# Patient Record
Sex: Male | Born: 1935 | Race: White | Hispanic: No | Marital: Married | Smoking: Former smoker
Health system: Southern US, Community
[De-identification: ages and names within clinical notes are randomized; demographics above are authoritative.]

## PROBLEM LIST (undated history)

## (undated) DIAGNOSIS — K219 Gastro-esophageal reflux disease without esophagitis: Secondary | ICD-10-CM

## (undated) DIAGNOSIS — J189 Pneumonia, unspecified organism: Secondary | ICD-10-CM

## (undated) DIAGNOSIS — M1990 Unspecified osteoarthritis, unspecified site: Secondary | ICD-10-CM

## (undated) HISTORY — PX: BACK SURGERY: SHX140

## (undated) HISTORY — PX: SHOULDER OPEN ROTATOR CUFF REPAIR: SHX2407

---

## 2007-10-29 ENCOUNTER — Emergency Department: Payer: Self-pay | Admitting: Emergency Medicine

## 2010-07-28 ENCOUNTER — Ambulatory Visit: Payer: Self-pay | Admitting: Family Medicine

## 2011-06-27 ENCOUNTER — Other Ambulatory Visit: Payer: Self-pay | Admitting: Neurosurgery

## 2011-07-05 ENCOUNTER — Encounter (HOSPITAL_COMMUNITY): Payer: Self-pay | Admitting: Pharmacy Technician

## 2011-07-10 ENCOUNTER — Encounter (HOSPITAL_COMMUNITY): Payer: Self-pay

## 2011-07-10 ENCOUNTER — Encounter (HOSPITAL_COMMUNITY): Payer: Self-pay | Admitting: Pharmacy Technician

## 2011-07-10 ENCOUNTER — Encounter (HOSPITAL_COMMUNITY)
Admission: RE | Admit: 2011-07-10 | Discharge: 2011-07-10 | Disposition: A | Discharge status: Home / Self Care | Payer: Medicare Other | Source: Ambulatory Visit | Attending: Neurosurgery | Admitting: Neurosurgery

## 2011-07-10 HISTORY — DX: Gastro-esophageal reflux disease without esophagitis: K21.9

## 2011-07-10 HISTORY — DX: Pneumonia, unspecified organism: J18.9

## 2011-07-10 HISTORY — DX: Unspecified osteoarthritis, unspecified site: M19.90

## 2011-07-10 LAB — BASIC METABOLIC PANEL
BUN: 20 mg/dL (ref 6–23)
Creatinine, Ser: 0.91 mg/dL (ref 0.50–1.35)
GFR calc Af Amer: >90 mL/min (ref >90)
GFR calc non Af Amer: 82 mL/min — ABNORMAL LOW (ref >90)
Glucose, Bld: 72 mg/dL (ref 70–99)

## 2011-07-10 LAB — TYPE AND SCREEN
ABO/RH(D): O POS
Antibody Screen: NEGATIVE

## 2011-07-10 LAB — CBC
HCT: 42.8 % (ref 39.0–52.0)
Hemoglobin: 15.3 g/dL (ref 13.0–17.0)
MCH: 32 pg (ref 26.0–34.0)
MCHC: 35.7 g/dL (ref 30.0–36.0)
RDW: 12 % (ref 11.5–15.5)

## 2011-07-10 LAB — SURGICAL PCR SCREEN: MRSA, PCR: NEGATIVE

## 2011-07-10 NOTE — Pre-Procedure Instructions (Signed)
20 Arthur Orozco  07/10/2011   Your procedure is scheduled on:  07/18/11  Report to [facility] at 5:30 AM.  Call this number if you have problems the morning of surgery: [phone]   Remember:   Do not eat food:After Midnight.  May have clear liquids: up to 4 Hours before arrival.  Clear liquids include soda, tea, black coffee, apple or grape juice, broth.  Take these medicines the morning of surgery with A SIP OF WATER: pain medicine is OK if needed   Do not wear jewelry, make-up or nail polish.  Do not wear lotions, powders, or perfumes. You may wear deodorant.  Do not shave 48 hours prior to surgery.  Do not bring valuables to the hospital.  Contacts, dentures or bridgework may not be worn into surgery.  Leave suitcase in the car. After surgery it may be brought to your room.  For patients admitted to the hospital, checkout time is 11:00 AM the day of discharge.   Patients discharged the day of surgery will not be allowed to drive home.  Name and phone number of your driver: with WIFE  Special Instructions: CHG Shower Use Special Wash: 1/2 bottle night before surgery and 1/2 bottle morning of surgery.   Please read over the following fact sheets that you were given: Pain Booklet, Coughing and Deep Breathing, Blood Transfusion Information, MRSA Information and Surgical Site Infection Prevention

## 2011-07-17 MED ORDER — CEFAZOLIN SODIUM-DEXTROSE 2-3 GM-% IV SOLR
2.0000 g | INTRAVENOUS | Status: AC
  Administered 2011-07-18 (×2): 2 g via INTRAVENOUS
  Filled 2011-07-17: qty 50

## 2011-07-18 ENCOUNTER — Encounter (HOSPITAL_COMMUNITY)
Admission: RE | Disposition: A | Discharge status: Home / Self Care | Payer: Self-pay | Source: Ambulatory Visit | Attending: Neurosurgery

## 2011-07-18 ENCOUNTER — Inpatient Hospital Stay (HOSPITAL_COMMUNITY): Payer: Medicare Other

## 2011-07-18 ENCOUNTER — Encounter (HOSPITAL_COMMUNITY): Payer: Self-pay | Admitting: Surgery

## 2011-07-18 ENCOUNTER — Inpatient Hospital Stay (HOSPITAL_COMMUNITY)
Admission: RE | Admit: 2011-07-18 | Discharge: 2011-07-21 | DRG: 458 | Disposition: A | Discharge status: Home / Self Care | Payer: Medicare Other | Source: Ambulatory Visit | Attending: Neurosurgery | Admitting: Neurosurgery

## 2011-07-18 ENCOUNTER — Encounter (HOSPITAL_COMMUNITY): Payer: Self-pay | Admitting: Anesthesiology

## 2011-07-18 ENCOUNTER — Inpatient Hospital Stay (HOSPITAL_COMMUNITY): Payer: Medicare Other | Admitting: Anesthesiology

## 2011-07-18 DIAGNOSIS — M51379 Other intervertebral disc degeneration, lumbosacral region without mention of lumbar back pain or lower extremity pain: Principal | ICD-10-CM | POA: Diagnosis present

## 2011-07-18 DIAGNOSIS — M4150 Other secondary scoliosis, site unspecified: Secondary | ICD-10-CM | POA: Diagnosis present

## 2011-07-18 DIAGNOSIS — M48062 Spinal stenosis, lumbar region with neurogenic claudication: Secondary | ICD-10-CM | POA: Diagnosis present

## 2011-07-18 DIAGNOSIS — M4310 Spondylolisthesis, site unspecified: Secondary | ICD-10-CM | POA: Diagnosis present

## 2011-07-18 DIAGNOSIS — M5137 Other intervertebral disc degeneration, lumbosacral region: Principal | ICD-10-CM | POA: Diagnosis present

## 2011-07-18 SURGERY — POSTERIOR LUMBAR FUSION 3 LEVEL
Anesthesia: General | Site: Back | Wound class: Clean

## 2011-07-18 MED ORDER — SODIUM CHLORIDE 0.9 % IJ SOLN: 3.0000 mL | INTRAMUSCULAR | Status: DC | PRN

## 2011-07-18 MED ORDER — DEXTROSE-NACL 5-0.45 % IV SOLN
INTRAVENOUS | Status: DC
  Administered 2011-07-19 (×2): via INTRAVENOUS

## 2011-07-18 MED ORDER — HYDROMORPHONE HCL PF 1 MG/ML IJ SOLN
INTRAMUSCULAR | Status: AC
  Filled 2011-07-18: qty 1

## 2011-07-18 MED ORDER — NALOXONE HCL 0.4 MG/ML IJ SOLN: 0.4000 mg | INTRAMUSCULAR | Status: DC | PRN

## 2011-07-18 MED ORDER — ACETAMINOPHEN 325 MG PO TABS: 650.0000 mg | ORAL_TABLET | ORAL | Status: DC | PRN

## 2011-07-18 MED ORDER — DOCUSATE SODIUM 100 MG PO CAPS
100.0000 mg | ORAL_CAPSULE | Freq: Two times a day (BID) | ORAL | Status: DC
  Administered 2011-07-18 – 2011-07-20 (×5): 100 mg via ORAL
  Filled 2011-07-18 (×5): qty 1

## 2011-07-18 MED ORDER — NEOSTIGMINE METHYLSULFATE 1 MG/ML IJ SOLN
INTRAMUSCULAR | Status: DC | PRN
  Administered 2011-07-18: 3.5 mg via INTRAVENOUS

## 2011-07-18 MED ORDER — ALBUMIN HUMAN 5 % IV SOLN
INTRAVENOUS | Status: DC | PRN
  Administered 2011-07-18: 12:00:00 via INTRAVENOUS

## 2011-07-18 MED ORDER — ONDANSETRON HCL 4 MG/2ML IJ SOLN: 4.0000 mg | Freq: Four times a day (QID) | INTRAMUSCULAR | Status: DC | PRN

## 2011-07-18 MED ORDER — SODIUM CHLORIDE 0.9 % IJ SOLN
3.0000 mL | Freq: Two times a day (BID) | INTRAMUSCULAR | Status: DC
  Administered 2011-07-19 – 2011-07-20 (×2): 3 mL via INTRAVENOUS

## 2011-07-18 MED ORDER — ROCURONIUM BROMIDE 100 MG/10ML IV SOLN
INTRAVENOUS | Status: DC | PRN
  Administered 2011-07-18: 20 mg via INTRAVENOUS
  Administered 2011-07-18: 50 mg via INTRAVENOUS
  Administered 2011-07-18 (×3): 20 mg via INTRAVENOUS

## 2011-07-18 MED ORDER — EPHEDRINE SULFATE 50 MG/ML IJ SOLN
INTRAMUSCULAR | Status: DC | PRN
  Administered 2011-07-18 (×3): 10 mg via INTRAVENOUS

## 2011-07-18 MED ORDER — LIDOCAINE-EPINEPHRINE 1 %-1:100000 IJ SOLN
INTRAMUSCULAR | Status: DC | PRN
  Administered 2011-07-18: 5 mL

## 2011-07-18 MED ORDER — MORPHINE SULFATE (PF) 1 MG/ML IV SOLN
INTRAVENOUS | Status: DC
  Administered 2011-07-18 (×2): via INTRAVENOUS
  Administered 2011-07-18: 24 mg via INTRAVENOUS
  Administered 2011-07-19: 1.5 mg via INTRAVENOUS
  Administered 2011-07-19: 10 mg via INTRAVENOUS
  Administered 2011-07-19: 25 mg via INTRAVENOUS
  Administered 2011-07-19: 10.5 mg via INTRAVENOUS
  Administered 2011-07-19: 6 mg via INTRAVENOUS
  Administered 2011-07-20 (×2): 1.5 mg via INTRAVENOUS

## 2011-07-18 MED ORDER — VITAMIN D3 25 MCG (1000 UNIT) PO TABS
2000.0000 [IU] | ORAL_TABLET | Freq: Every day | ORAL | Status: DC
  Administered 2011-07-18 – 2011-07-21 (×4): 2000 [IU] via ORAL
  Filled 2011-07-18 (×4): qty 2

## 2011-07-18 MED ORDER — SODIUM CHLORIDE 0.9 % IJ SOLN: 9.0000 mL | INTRAMUSCULAR | Status: DC | PRN

## 2011-07-18 MED ORDER — SODIUM CHLORIDE 0.9 % IV SOLN
INTRAVENOUS | Status: AC
  Filled 2011-07-18: qty 500

## 2011-07-18 MED ORDER — ONDANSETRON HCL 4 MG/2ML IJ SOLN: 4.0000 mg | INTRAMUSCULAR | Status: DC | PRN

## 2011-07-18 MED ORDER — MORPHINE SULFATE (PF) 1 MG/ML IV SOLN
INTRAVENOUS | Status: AC
  Filled 2011-07-18: qty 25

## 2011-07-18 MED ORDER — BACITRACIN 50000 UNITS IM SOLR
INTRAMUSCULAR | Status: AC
  Filled 2011-07-18: qty 1

## 2011-07-18 MED ORDER — 0.9 % SODIUM CHLORIDE (POUR BTL) OPTIME
TOPICAL | Status: DC | PRN
  Administered 2011-07-18: 1000 mL

## 2011-07-18 MED ORDER — HYDROMORPHONE HCL PF 1 MG/ML IJ SOLN
0.2500 mg | INTRAMUSCULAR | Status: DC | PRN
  Administered 2011-07-18 (×2): 0.5 mg via INTRAVENOUS

## 2011-07-18 MED ORDER — PHENYLEPHRINE HCL 10 MG/ML IJ SOLN
10.0000 mg | INTRAVENOUS | Status: DC | PRN
  Administered 2011-07-18: 10 ug/min via INTRAVENOUS

## 2011-07-18 MED ORDER — MENTHOL 3 MG MT LOZG: 1.0000 | LOZENGE | OROMUCOSAL | Status: DC | PRN

## 2011-07-18 MED ORDER — ONDANSETRON HCL 4 MG/2ML IJ SOLN
INTRAMUSCULAR | Status: DC | PRN
  Administered 2011-07-18: 4 mg via INTRAVENOUS

## 2011-07-18 MED ORDER — THROMBIN 20000 UNITS EX KIT
PACK | CUTANEOUS | Status: DC | PRN
  Administered 2011-07-18: 09:00:00 via TOPICAL

## 2011-07-18 MED ORDER — OXYCODONE-ACETAMINOPHEN 5-325 MG PO TABS: 1.0000 | ORAL_TABLET | ORAL | Status: DC | PRN

## 2011-07-18 MED ORDER — BUPIVACAINE HCL (PF) 0.5 % IJ SOLN
INTRAMUSCULAR | Status: DC | PRN
  Administered 2011-07-18: 5 mL

## 2011-07-18 MED ORDER — LIDOCAINE HCL (CARDIAC) 20 MG/ML IV SOLN
INTRAVENOUS | Status: DC | PRN
  Administered 2011-07-18: 70 mg via INTRAVENOUS

## 2011-07-18 MED ORDER — SODIUM CHLORIDE 0.9 % IV SOLN
INTRAVENOUS | Status: DC | PRN
  Administered 2011-07-18: 12:00:00 via INTRAVENOUS

## 2011-07-18 MED ORDER — PHENOL 1.4 % MT LIQD: 1.0000 | OROMUCOSAL | Status: DC | PRN

## 2011-07-18 MED ORDER — PROPOFOL 10 MG/ML IV EMUL
INTRAVENOUS | Status: DC | PRN
  Administered 2011-07-18: 150 mg via INTRAVENOUS

## 2011-07-18 MED ORDER — CEFAZOLIN SODIUM-DEXTROSE 2-3 GM-% IV SOLR
INTRAVENOUS | Status: AC
  Filled 2011-07-18: qty 50

## 2011-07-18 MED ORDER — ZINC 50 MG PO CAPS
1.0000 | ORAL_CAPSULE | Freq: Every day | ORAL | Status: DC
  Filled 2011-07-18: qty 1

## 2011-07-18 MED ORDER — ASPIRIN EC 81 MG PO TBEC
81.0000 mg | DELAYED_RELEASE_TABLET | Freq: Every day | ORAL | Status: DC
  Administered 2011-07-18 – 2011-07-21 (×4): 81 mg via ORAL
  Filled 2011-07-18 (×4): qty 1

## 2011-07-18 MED ORDER — ACETAMINOPHEN 650 MG RE SUPP: 650.0000 mg | RECTAL | Status: DC | PRN

## 2011-07-18 MED ORDER — SUFENTANIL CITRATE 50 MCG/ML IV SOLN
INTRAVENOUS | Status: DC | PRN
  Administered 2011-07-18 (×6): 10 ug via INTRAVENOUS
  Administered 2011-07-18: 20 ug via INTRAVENOUS

## 2011-07-18 MED ORDER — DIPHENHYDRAMINE HCL 50 MG/ML IJ SOLN: 12.5000 mg | Freq: Four times a day (QID) | INTRAMUSCULAR | Status: DC | PRN

## 2011-07-18 MED ORDER — SODIUM CHLORIDE 0.9 % IV SOLN: 250.0000 mL | INTRAVENOUS | Status: DC

## 2011-07-18 MED ORDER — ZINC SULFATE 220 (50 ZN) MG PO CAPS
220.0000 mg | ORAL_CAPSULE | Freq: Every day | ORAL | Status: DC
  Administered 2011-07-18 – 2011-07-21 (×3): 220 mg via ORAL
  Filled 2011-07-18 (×4): qty 1

## 2011-07-18 MED ORDER — MIDAZOLAM HCL 5 MG/5ML IJ SOLN
INTRAMUSCULAR | Status: DC | PRN
  Administered 2011-07-18: 2 mg via INTRAVENOUS

## 2011-07-18 MED ORDER — CEFAZOLIN SODIUM 1-5 GM-% IV SOLN
1.0000 g | Freq: Three times a day (TID) | INTRAVENOUS | Status: AC
  Administered 2011-07-18 – 2011-07-19 (×2): 1 g via INTRAVENOUS
  Filled 2011-07-18 (×2): qty 50

## 2011-07-18 MED ORDER — FAMOTIDINE 20 MG PO TABS
20.0000 mg | ORAL_TABLET | Freq: Two times a day (BID) | ORAL | Status: DC
  Administered 2011-07-18 – 2011-07-21 (×5): 20 mg via ORAL
  Filled 2011-07-18 (×7): qty 1

## 2011-07-18 MED ORDER — PHENYLEPHRINE HCL 10 MG/ML IJ SOLN
INTRAMUSCULAR | Status: DC | PRN
  Administered 2011-07-18: 50 ug via INTRAVENOUS
  Administered 2011-07-18 (×3): 40 ug via INTRAVENOUS
  Administered 2011-07-18 (×3): 50 ug via INTRAVENOUS
  Administered 2011-07-18: 40 ug via INTRAVENOUS

## 2011-07-18 MED ORDER — VITAMIN B-12 1000 MCG PO TABS
1000.0000 ug | ORAL_TABLET | Freq: Every day | ORAL | Status: DC
  Administered 2011-07-18 – 2011-07-21 (×4): 1000 ug via ORAL
  Filled 2011-07-18 (×4): qty 1

## 2011-07-18 MED ORDER — ZOLPIDEM TARTRATE 5 MG PO TABS: 5.0000 mg | ORAL_TABLET | Freq: Every evening | ORAL | Status: DC | PRN

## 2011-07-18 MED ORDER — VITAMIN D3 50 MCG (2000 UT) PO TABS: 1.0000 | ORAL_TABLET | Freq: Every day | ORAL | Status: DC

## 2011-07-18 MED ORDER — LACTATED RINGERS IV SOLN
INTRAVENOUS | Status: DC | PRN
  Administered 2011-07-18: 07:00:00 via INTRAVENOUS

## 2011-07-18 MED ORDER — DIPHENHYDRAMINE HCL 12.5 MG/5ML PO ELIX: 12.5000 mg | ORAL_SOLUTION | Freq: Four times a day (QID) | ORAL | Status: DC | PRN

## 2011-07-18 MED ORDER — OXYCODONE-ACETAMINOPHEN 5-325 MG PO TABS
1.0000 | ORAL_TABLET | ORAL | Status: DC | PRN
  Administered 2011-07-19 – 2011-07-20 (×3): 2 via ORAL
  Administered 2011-07-20: 1 via ORAL
  Administered 2011-07-20 – 2011-07-21 (×5): 2 via ORAL
  Filled 2011-07-18 (×6): qty 2
  Filled 2011-07-18: qty 1
  Filled 2011-07-18 (×2): qty 2

## 2011-07-18 MED ORDER — SODIUM CHLORIDE 0.9 % IR SOLN
Status: DC | PRN
  Administered 2011-07-18 (×2)

## 2011-07-18 MED ORDER — HETASTARCH-ELECTROLYTES 6 % IV SOLN
INTRAVENOUS | Status: DC | PRN
  Administered 2011-07-18: 10:00:00 via INTRAVENOUS

## 2011-07-18 MED ORDER — GLYCOPYRROLATE 0.2 MG/ML IJ SOLN
INTRAMUSCULAR | Status: DC | PRN
  Administered 2011-07-18: .7 mg via INTRAVENOUS

## 2011-07-18 SURGICAL SUPPLY — 79 items
BAG DECANTER FOR FLEXI CONT (MISCELLANEOUS) ×2 IMPLANT
BENZOIN TINCTURE PRP APPL 2/3 (GAUZE/BANDAGES/DRESSINGS) ×2 IMPLANT
BLADE SURG ROTATE 9660 (MISCELLANEOUS) ×2 IMPLANT
BONE VOID FILLER STRIP 10CC (Bone Implant) ×4 IMPLANT
BUR MATCHSTICK NEURO 3.0 LAGG (BURR) ×2 IMPLANT
BUR PRECISION FLUTE 5.0 (BURR) ×2 IMPLANT
CANISTER SUCTION 2500CC (MISCELLANEOUS) ×2 IMPLANT
CLOTH BEACON ORANGE TIMEOUT ST (SAFETY) ×2 IMPLANT
CONT SPEC 4OZ CLIKSEAL STRL BL (MISCELLANEOUS) ×4 IMPLANT
COVER BACK TABLE 24X17X13 BIG (DRAPES) IMPLANT
COVER TABLE BACK 60X90 (DRAPES) ×2 IMPLANT
DERMABOND ADVANCED (GAUZE/BANDAGES/DRESSINGS)
DERMABOND ADVANCED .7 DNX12 (GAUZE/BANDAGES/DRESSINGS) IMPLANT
DRAPE C-ARM 42X72 X-RAY (DRAPES) ×4 IMPLANT
DRAPE LAPAROTOMY 100X72X124 (DRAPES) ×2 IMPLANT
DRAPE POUCH INSTRU U-SHP 10X18 (DRAPES) ×2 IMPLANT
DRAPE PROXIMA HALF (DRAPES) ×2 IMPLANT
DRAPE SURG 17X23 STRL (DRAPES) ×2 IMPLANT
DRESSING TELFA 8X3 (GAUZE/BANDAGES/DRESSINGS) IMPLANT
DURAPREP 26ML APPLICATOR (WOUND CARE) ×2 IMPLANT
ELECT BLADE 4.0 EZ CLEAN MEGAD (MISCELLANEOUS) ×2
ELECT REM PT RETURN 9FT ADLT (ELECTROSURGICAL) ×2
ELECTRODE BLDE 4.0 EZ CLN MEGD (MISCELLANEOUS) ×1 IMPLANT
ELECTRODE REM PT RTRN 9FT ADLT (ELECTROSURGICAL) ×1 IMPLANT
EVACUATOR 1/8 PVC DRAIN (DRAIN) ×2 IMPLANT
GAUZE SPONGE 4X4 16PLY XRAY LF (GAUZE/BANDAGES/DRESSINGS) ×2 IMPLANT
GLOVE BIO SURGEON STRL SZ8 (GLOVE) ×4 IMPLANT
GLOVE BIOGEL PI IND STRL 7.5 (GLOVE) ×1 IMPLANT
GLOVE BIOGEL PI IND STRL 8 (GLOVE) ×5 IMPLANT
GLOVE BIOGEL PI IND STRL 8.5 (GLOVE) ×2 IMPLANT
GLOVE BIOGEL PI INDICATOR 7.5 (GLOVE) ×1
GLOVE BIOGEL PI INDICATOR 8 (GLOVE) ×5
GLOVE BIOGEL PI INDICATOR 8.5 (GLOVE) ×2
GLOVE ECLIPSE 7.5 STRL STRAW (GLOVE) ×16 IMPLANT
GLOVE ECLIPSE 8.0 STRL XLNG CF (GLOVE) ×4 IMPLANT
GLOVE EXAM NITRILE LRG STRL (GLOVE) IMPLANT
GLOVE EXAM NITRILE MD LF STRL (GLOVE) ×2 IMPLANT
GLOVE EXAM NITRILE XL STR (GLOVE) IMPLANT
GLOVE EXAM NITRILE XS STR PU (GLOVE) IMPLANT
GOWN BRE IMP SLV AUR LG STRL (GOWN DISPOSABLE) ×2 IMPLANT
GOWN BRE IMP SLV AUR XL STRL (GOWN DISPOSABLE) ×6 IMPLANT
GOWN STRL REIN 2XL LVL4 (GOWN DISPOSABLE) ×8 IMPLANT
KIT BASIN OR (CUSTOM PROCEDURE TRAY) ×2 IMPLANT
KIT INFUSE LRG II (Orthopedic Implant) ×2 IMPLANT
KIT POSITION SURG JACKSON T1 (MISCELLANEOUS) ×2 IMPLANT
KIT ROOM TURNOVER OR (KITS) ×2 IMPLANT
MILL MEDIUM DISP (BLADE) ×2 IMPLANT
NEEDLE HYPO 25X1 1.5 SAFETY (NEEDLE) ×2 IMPLANT
NEEDLE SPNL 18GX3.5 QUINCKE PK (NEEDLE) IMPLANT
NS IRRIG 1000ML POUR BTL (IV SOLUTION) ×4 IMPLANT
PACK LAMINECTOMY NEURO (CUSTOM PROCEDURE TRAY) ×2 IMPLANT
PAD ARMBOARD 7.5X6 YLW CONV (MISCELLANEOUS) ×6 IMPLANT
PATTIES SURGICAL .5 X.5 (GAUZE/BANDAGES/DRESSINGS) IMPLANT
PATTIES SURGICAL .5 X1 (DISPOSABLE) IMPLANT
PATTIES SURGICAL 1X1 (DISPOSABLE) IMPLANT
PEEK PLIF NOVEL 9X25X10 (Peek) ×16 IMPLANT
ROD 120MM (Rod) ×4 IMPLANT
SCREW 40MM (Screw) ×4 IMPLANT
SCREW 50MM (Screw) ×12 IMPLANT
SCREW POLYAX 6.5X45MM (Screw) ×4 IMPLANT
SCREW SET SPINAL STD HEXALOBE (Screw) ×20 IMPLANT
SPONGE GAUZE 4X4 12PLY (GAUZE/BANDAGES/DRESSINGS) ×2 IMPLANT
SPONGE LAP 4X18 X RAY DECT (DISPOSABLE) IMPLANT
SPONGE NEURO XRAY DETECT 1X3 (DISPOSABLE) ×2 IMPLANT
SPONGE SURGIFOAM ABS GEL 100 (HEMOSTASIS) ×2 IMPLANT
STAPLER SKIN PROX WIDE 3.9 (STAPLE) IMPLANT
STRIP CLOSURE SKIN 1/2X4 (GAUZE/BANDAGES/DRESSINGS) ×2 IMPLANT
SUT VIC AB 1 CT1 18XBRD ANBCTR (SUTURE) ×2 IMPLANT
SUT VIC AB 1 CT1 8-18 (SUTURE) ×2
SUT VIC AB 2-0 CT1 18 (SUTURE) ×4 IMPLANT
SUT VIC AB 3-0 SH 8-18 (SUTURE) ×4 IMPLANT
SYR 20CC LL (SYRINGE) ×2 IMPLANT
SYR 3ML LL SCALE MARK (SYRINGE) ×12 IMPLANT
SYR 5ML LL (SYRINGE) IMPLANT
TOWEL OR 17X24 6PK STRL BLUE (TOWEL DISPOSABLE) ×2 IMPLANT
TOWEL OR 17X26 10 PK STRL BLUE (TOWEL DISPOSABLE) ×2 IMPLANT
TRAP SPECIMEN MUCOUS 40CC (MISCELLANEOUS) ×2 IMPLANT
TRAY FOLEY CATH 14FRSI W/METER (CATHETERS) ×2 IMPLANT
WATER STERILE IRR 1000ML POUR (IV SOLUTION) ×2 IMPLANT

## 2011-07-18 NOTE — Transfer of Care (Signed)
Immediate Anesthesia Transfer of Care Note  Patient: Arthur Orozco  Procedure(s) Performed: Procedure(s) (LRB): POSTERIOR LUMBAR FUSION 3 LEVEL (N/A)  Patient Location: PACU  Anesthesia Type: General  Level of Consciousness: sedated and patient cooperative  Airway & Oxygen Therapy: Patient Spontanous Breathing and Patient connected to nasal cannula oxygen  Post-op Assessment: Report given to PACU RN, Post -op Vital signs reviewed and stable and Patient moving all extremities X 4  Post vital signs: Reviewed and stable  Complications: No apparent anesthesia complications

## 2011-07-18 NOTE — Op Note (Signed)
07/18/2011  2:35 PM  PATIENT:  Arthur Orozco  76 y.o. male  PRE-OPERATIVE DIAGNOSIS:  scoliosis, spondylolisthesis, lumbar degenerative disc disease, lumbar spondylosis, stenosis, lumbar radiculopathy L2- S1 levels  POST-OPERATIVE DIAGNOSIS:  scoliosis, spondylolisthesis, lumbar degenerative disc disease, lumbar spondylosis, lumbar radiculopathy L2-S1 levels.  PROCEDURE:  Procedure(s) (LRB): POSTERIOR LUMBAR decompression with redo decompression L2-S1 bilaterally in excess of that required for PLIF; PLIF with PEEK cages, autograft and BMP L2/3, L3/4, L4/5, L5/S1 bilaterally, Segmental pedicle screw fixation L2-S1 bilaterally, Posterolateral arthrodesis L2 -S1 bilaterally with autograft.  SURGEON:  Surgeon(s) and Role:    * Maeola Harman, MD - Primary    * Clydene Fake, MD - Assisting  PHYSICIAN ASSISTANT:   ASSISTANTS: Poteat, RN   ANESTHESIA:   general  EBL:  Total I/O In: 4725 [I.V.:3800; Blood:175; IV Piggyback:750] Out: 1085 [Urine:685; Blood:400]  BLOOD ADMINISTERED:175 CC PRBC  DRAINS: (Medium) Hemovact drain(s) in the epidural with  Suction Open   LOCAL MEDICATIONS USED:  LIDOCAINE   SPECIMEN:  No Specimen  DISPOSITION OF SPECIMEN:  N/A  COUNTS:  YES  TOURNIQUET:  * No tourniquets in log *  DICTATION: DICTATION: Patient is 76 year old man with  HNP, scoliosis, mobile spondylolisthesis, spondylosis, stenosis, DDD, radiculopathy L2/3, L3/4, L4/5, L5/S1. He has a severe bilateral leg pain and weakness. He has had a previous laminectomy. It was elected to take him to surgery for decompression and fusion at L2/3, L3/4, 4/5 and L5/S1 levels.    Procedure: Patient was placed in a prone position on the [place] table after smooth and uncomplicated induction of general endotracheal anesthesia. His low back was prepped and draped in usual sterile fashion with DuraPrep. Area of incision was infiltrated with local lidocaine. Incision was made to the lumbodorsal fascia was  incised and exposure was performed of the L2/3, L3/4, L4/5, L5-S1 spinous processes laminae facet joint and transverse processes. Intraoperative x-ray was obtained which confirmed correct orientation. A total laminectomy of L2, L3, L4 and L5 was performed with disarticulation of the facet joints at this level and thorough decompression was performed of both L2, L3, L4, L5 and S1 nerve roots along with the common dural tube. There was densely adherent spondylytic material compressing the thecal sac and both L4 and L5 nerve roots and scar tissue adherent to the right S1 nerve root.  Total laminectomy of L2 through S1 levels was performed greatly in excess of that required for PLIF. A thorough discectomy and preparation of the endplates was performed at both the L2/3, L3/4, L4/5, L5-S1 levels.  The interspaces were packed with medium BMP,NexOss bone graft extender, and PEEK cages (10mm bilaterally at each level) . Bone autograft was packed within the interspace bilaterally along with large BMP kit and NexOss bone graft extender. 6 cc of autograft was placed at each level. The posterolateral region was extensively decorticated and pedicle probes were placed at L2/3, L3/4, L4/5, L5-S1 bilaterally. Intraoperative fluoroscopy confirmed correct orientationin the AP and lateral plane. 40 x 6.5 mm pedicle screws were placed at S1 bilaterally and 50 x 6.5 mm screws placed at L5, L4, L3, L2  bilaterally.  Final x-rays demonstrated well-positioned interbody grafts and pedicle screw fixation. 120 mm lordotic rods were placed and locked down in situ bilaterally and the posterolateral region was packed with the remaining BMP and bone graft extender on the left and bone autograft on the right. Medium Hemovac drain was placed and anchored with a stitch. Fascia was closed with 1 Vicryl sutures skin edges  were reapproximated 2 and 3-0 Vicryl sutures. The wound is dressed with benzoin Steri-Strips Telfa gauze and tape the patient was  extubated in the operating room and taken to recovery in stable satisfactory condition he tolerated operation well. Counts were correct at the end of the case.    PLAN OF CARE: Admit to inpatient   PATIENT DISPOSITION:  PACU - hemodynamically stable.   Delay start of Pharmacological VTE agent (>24hrs) due to surgical blood loss or risk of bleeding: yes

## 2011-07-18 NOTE — Anesthesia Postprocedure Evaluation (Signed)
Anesthesia Post Note  Patient: Arthur Orozco  Procedure(s) Performed: Procedure(s) (LRB): POSTERIOR LUMBAR FUSION 3 LEVEL (N/A)  Anesthesia type: General  Patient location: PACU  Post pain: Pain level controlled and Adequate analgesia  Post assessment: Post-op Vital signs reviewed, Patient's Cardiovascular Status Stable, Respiratory Function Stable, Patent Airway and Pain level controlled  Last Vitals:  Filed Vitals:   07/18/11 1500  BP: 121/83  Pulse: 77  Temp:   Resp: 15    Post vital signs: Reviewed and stable  Level of consciousness: awake, alert  and oriented  Complications: No apparent anesthesia complications

## 2011-07-18 NOTE — H&P (Signed)
Arthur Orozco #[id number] DOB: 1936-04-09  06/26/2011: Arthur Orozco returns today to review his MRI of the lumbar spine. This was performed through [facility] Radiology Department on 06/21/2011. This demonstrates severe multilevel degenerative changes, with moderate to severe biforaminal narrowing at L2-3, L3-4, L4-5, and L5-S1 levels, with moderate to severe stenosis at L2-3, L3-4, and L4-5 levels. In addition, there is scoliosis of the lumbar spine.  I explained to the patient after reviewing his imaging studies that the surgical treatment would be involved and would consist of a decompression and fusion L2-3, L3-4, L4-5, and L5-S1 levels. He wishes to go ahead with this and says he cannot live with his level of pain, and needs to do something about it. He wishes to proceed with surgery and this has been set up for 07/18/2011. He will be fitted for an LSO brace. We will have him come back to meet with Arlys John who is sick today and unable to participate in discussion of what to expect around the surgery, and he will come back to review that. The patient wishes to proceed. Risks and benefits were discussed.  Danae Orleans. Venetia Maxon, M.D./aft cc: Dr. Vanessa [place]. 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 He notes numbness into both of his legs, left greater than right, and into his feet. He notes weakness in his right knee. He previously saw Dr. Elesa Hacker in approximately 2001 and says that he has had problems since then, but which have steadily worsened. He took two rounds of steroids which he said helped him last year. He has had a past surgical history of rotator cuff surgery on the right in 2001 after he fell off of a truck and disc surgery in the 1960's. He has been taking Advil 200 mg 4 tablets 3 x daily. He describes that he  cannot stand for any length of time, that is left leg goes numb and that his right leg has also started to bother him. He is able to stand longer with a grocery cart or with leaning forward.   REVIEW OF SYSTEMS: Review of Systems was reviewed with the patient. Pertinent positives include wears glasses, hearing loss, leg pain while walking, leg weakness, back pain, leg pain, joint pain or swelling, neck pain.   PAST MEDICAL HISTORY:   Current Medical Conditions: As previously described.   Prior Operations and Hospitalizations: As previously described.   Medications and Allergies: Aspirin 81 mg qd, Fish Oil 1000 mg qd, Advil 800 mg qd, Vitamin D qd, Vitamin C qd, Vitamin B12 qd and Zinc qd. No known drug allergies.   Height and Weight: He is 5', 9" tall and 170 lbs.   FAMILY HISTORY: Both parents deceased with a family history of diabetes and previous back surgery.   SOCIAL HISTORY: He denies tobacco or drug use. He drinks 1 to 2 alcoholic beverages per year.   DIAGNOSTIC STUDIES: He had lumbar radiographs in the office today which show low thoracic spondylosis, as well as the upper lumbar region and dextroconvex lumbar scoliosis. He has retrolisthesis of L3 on L4 and anterolisthesis of L5 on S1 which do not appear to be significantly mobile on flexion and extension views.   PHYSICAL EXAMINATION:   General Appearance: On examination today Arthur Orozco is a pleasant, cooperative man in  no acute distress.   Blood Pressure, Pulse and Respiratory Rate: His blood pressure is 140/80. Heart rate is 64 and regular. Respiratory rate is 16.   HEENT - normocephalic, atraumatic. The pupils are equal, round and reactive to light. The extraocular muscles are intact. Sclerae - white. Conjunctiva - pink. Oropharynx benign. Uvula midline.   Neck - there are no masses, meningismus, deformities, tracheal deviation, jugular vein distention or carotid bruits. There is normal cervical range of motion. Spurlings'  test is negative without reproducible radicular pain turning the patient's head to either side. Lhermitte's sign is not present with axial compression.   Respiratory - there is normal respiratory effort with good intercostal function. Lungs are clear to auscultation. There are no rales, rhonchi or wheezes.   Cardiovascular - the heart has regular rate and rhythm to auscultation. No murmurs are appreciated. There is no extremity edema, cyanosis or clubbing. There are palpable pedal pulses.   Abdomen - soft, nontender, no hepatosplenomegaly appreciated or masses. There are active bowel sounds. No guarding or rebound.   Musculoskeletal Examination - he has pain at the lumbosacral junction and upper buttocks. He is able to bend to within 8 inches of the floor with his upper extremities outstretched. He is able to stand on his heels and toes. Straight leg raise is negative for radicular pain.   NEUROLOGICAL EXAMINATION: The patient is oriented to time, person and place and has good recall of both recent and remote memory with normal attention span and concentration. The patient speaks with clear and fluent speech and exhibits normal language function and appropriate fund of knowledge.   Cranial Nerve Examination - pupils are equal, round and reactive to light. Extraocular movements are full. Visual fields are full to confrontational testing. Facial sensation and facial movement are symmetric and intact. Hearing is intact to finger rub. Palate is upgoing. Shoulder shrug is symmetric. Tongue protrudes in the midline.   Motor Examination - motor strength is 5/5 in the bilateral deltoids, biceps, triceps, handgrips, wrist extensors, interosseous. In the lower extremities motor strength is 5/5 in hip flexion, extension, quadriceps, hamstrings, plantar flexion, dorsiflexion and extensor hallucis longus.   Sensory Examination - normal to light touch and pinprick sensation in the upper and lower extremities.    Deep Tendon Reflexes - 2 in the biceps, triceps and brachioradialis, 2 at the knees, absent at the ankles and great toes are downgoing to plantar stimulation.   Cerebellar Examination - normal coordination in upper and lower extremities and normal rapid alternating movements. Romberg test is negative.   MPRESSION AND RECOMMENDATIONS:  Arthur Orozco is a 76 year old man with bilateral lower extremity pain and symptomatic neurogenic claudication. His history is consistent with spinal stenosis. I have recommended that we go ahead with an MRI of the lumbar spine and I will see him back after that has been done and make further recommendations at that point.   [facility]  Arthur Orozco. Venetia Maxon, M.D.  JDS:sv  cc: Dr. Bobby Rumpf

## 2011-07-18 NOTE — Anesthesia Preprocedure Evaluation (Addendum)
Anesthesia Evaluation  Patient identified by MRN, date of birth, ID band Patient awake    Reviewed: Allergy & Precautions, H&P , NPO status , Patient's Chart, lab work & pertinent test results  Airway Mallampati: II  Neck ROM: full    Dental  (+) Teeth Intact and Dental Advisory Given   Pulmonary          Cardiovascular     Neuro/Psych    GI/Hepatic GERD-  ,  Endo/Other    Renal/GU      Musculoskeletal  (+) Arthritis -,   Abdominal   Peds  Hematology   Anesthesia Other Findings   Reproductive/Obstetrics                          Anesthesia Physical Anesthesia Plan  ASA: II  Anesthesia Plan: General   Post-op Pain Management:    Induction: Intravenous  Airway Management Planned: Oral ETT  Additional Equipment:   Intra-op Plan:   Post-operative Plan: Extubation in OR  Informed Consent: I have reviewed the patients History and Physical, chart, labs and discussed the procedure including the risks, benefits and alternatives for the proposed anesthesia with the patient or authorized representative who has indicated his/her understanding and acceptance.     Plan Discussed with: CRNA and Surgeon  Anesthesia Plan Comments:         Anesthesia Quick Evaluation  

## 2011-07-18 NOTE — Interval H&P Note (Signed)
History and Physical Interval Note:  07/18/2011 7:27 AM  Arthur Orozco  has presented today for surgery, with the diagnosis of scoliosis spondylolisthesis lumbar degenerative disc disease lumbar spondylosis  The various methods of treatment have been discussed with the patient and family. After consideration of risks, benefits and other options for treatment, the patient has consented to  Procedure(s) (LRB): POSTERIOR LUMBAR FUSION 3 LEVEL (N/A) as a surgical intervention .  The patients' history has been reviewed, patient examined, no change in status, stable for surgery.  I have reviewed the patients' chart and labs.  Questions were answered to the patient's satisfaction.     [name],Desjuan D  Date of Initial H&P: 06/26/2011  History reviewed, patient examined, no change in status, stable for surgery.

## 2011-07-18 NOTE — Preoperative (Signed)
Beta Blockers   Reason not to administer Beta Blockers:Not Applicable 

## 2011-07-19 ENCOUNTER — Encounter (HOSPITAL_COMMUNITY): Payer: Self-pay | Admitting: *Deleted

## 2011-07-19 MED ORDER — MORPHINE SULFATE (PF) 1 MG/ML IV SOLN
INTRAVENOUS | Status: AC
  Filled 2011-07-19: qty 25

## 2011-07-19 NOTE — [place] Notes (Signed)
   CARE MANAGEMENT NOTE 07/19/2011  Patient:  Arthur Orozco, Arthur Orozco   Account Number:  [id number]  Date Initiated:  07/19/2011  Documentation initiated by:  Danaya Geddis  Subjective/Objective Assessment:   PT/OT eval, no DME recommendations. Recommended HHPT     Action/Plan:   Met with pt and family, pt declined HHPT.   Anticipated DC Date:  07/21/2011   Anticipated DC Plan:  HOME/SELF CARE         Choice offered to / List presented to:             Status of service:  Completed, signed off Medicare Important Message given?   (If response is "NO", the following Medicare IM given date fields will be blank) Date Medicare IM given:   Date Additional Medicare IM given:    Discharge Disposition:  HOME/SELF CARE  Per UR Regulation:  Reviewed for med. necessity/level of care/duration of stay  If discussed at Long Length of Stay Meetings, dates discussed:    Comments:  07/19/2011 Met with pt and family, pt declined HHPT, has no DME needs. Johny Shock RN MPH Case Manager [phone]

## 2011-07-19 NOTE — [place] Notes (Signed)
Clinical Social Worker received referral for new skilled nursing facility placement. Per chart there was an order for SNF versus home with home health. Per PT evaluation patient is able to return home with home health following. CM aware.   Clinical Social Worker will sign off for now as social work intervention is no longer needed. Please consult [place] again if new need arises.   [address] [place], [place]  [phone]

## 2011-07-19 NOTE — [place] Notes (Signed)
Subjective: Patient reports "I feel pretty good."  Objective: Vital signs in last 24 hours: Temp:  [97.4 F (36.3 C)-99.7 F (37.6 C)] 99.7 F (37.6 C) (03/22 1040) Pulse Rate:  [67-101] 101  (03/22 1040) Resp:  [11-24] 20  (03/22 1040) BP: (77-148)/(40-92) 116/67 mmHg (03/22 1040) SpO2:  [92 %-97 %] 92 % (03/22 1040) FiO2 (%):  [42 %-43 %] 42 % (03/22 0426) Weight:  [78.926 kg (174 lb)] 78.926 kg (174 lb) (03/22 0407)  Intake/Output from previous day: 03/21 0701 - 03/22 0700 In: 4900 [I.V.:3800; Blood:175; IV Piggyback:750] Out: 3195 [Urine:2085; Drains:710; Blood:400] Intake/Output this shift: Total I/O In: 360 [P.O.:360] Out: -   Alert, conversant. No leg pain. Lumbar soreness - reassured. Drain patent, ~300cc last pm - will leave in today. Good strength BLE. Drsg intact, dry.  Lab Results: No results found for this basename: WBC:2,HGB:2,HCT:2,PLT:2 in the last 72 hours BMET No results found for this basename: NA:2,K:2,CL:2,CO2:2,GLUCOSE:2,BUN:2,CREATININE:2,CALCIUM:2 in the last 72 hours  Studies/Results: Dg Lumbar Spine 2-3 Views  07/18/2011  *RADIOLOGY REPORT*  Clinical Data: L2-S1 fusion  LUMBAR SPINE - 2-3 VIEW  Comparison: Intraoperative radiographs 07/18/2011.  Findings: Bilateral pedicle screws are present at L2, L3, L4, L5, and S1.  Alignment is stable.  The screws appear well placed. Graft material is noted at the intervening disc spaces. A wide laminectomy has been performed.  IMPRESSION:  1.  Interval discectomy, wide laminectomy, and intervertebral graft material from L2-S1. 2.  No radiographic evidence for complication.  Original Report Authenticated By: Jamesetta Orleans. MATTERN, M.D.   Dg Lumbar Spine 2-3 Views  07/18/2011  *RADIOLOGY REPORT*  Clinical Data: Back pain  LUMBAR SPINE - 2-3 VIEW  Comparison: None.  Findings: Film #1 demonstrates probes directed most closely at the L3, L4, and L5 pedicles.  Film #2 demonstrates  probes directed most closely at the L2,  L3, L4, and L5 pedicles.  IMPRESSION: As above.  Original Report Authenticated By: Elsie Stain, M.D.    Assessment/Plan: Improving   LOS: 1 day  Mobilize in LSO with PT.   Georgiann Cocker 07/19/2011, 10:49 AM

## 2011-07-19 NOTE — Evaluation (Signed)
Occupational Therapy Evaluation Patient Details Name: Arthur Orozco MRN: [id number] DOB: Sep 19, 1935 Today's Date: 07/19/2011  Problem List: There is no problem list on file for this patient.   Past Medical History:  Past Medical History  Diagnosis Date  . Pneumonia     HOSP. - [facility]- 2001  . GERD (gastroesophageal reflux disease)   . Arthritis     cerv., lumbar spondylosis   Past Surgical History:  Past Surgical History  Procedure Date  . Back surgery     lumbar- 1970's  . Shoulder open rotator cuff repair     right shoulder, following fall from truck    OT Assessment/Plan/Recommendation OT Assessment Clinical Impression Statement: Pt admitted for POSTERIOR LUMBAR decompression with redo decompression L2-S1 bilaterally in excess of that required for PLIF and presents with the below problem list. Arthur benefit from skilled OT in the acute setting to maximize I with ADL and ADL mobility to Mod I- S level upon d/c home. Recommending HHOT at this time, but may not need any OT f/u pending progress. OT Recommendation/Assessment: Patient Arthur need skilled OT in the acute care venue OT Problem List: Decreased activity tolerance;Impaired balance (sitting and/or standing);Decreased knowledge of use of DME or AE;Decreased knowledge of precautions;Pain OT Therapy Diagnosis : Acute pain OT Plan OT Frequency: Min 2X/week OT Treatment/Interventions: Self-care/ADL training;DME and/or AE instruction;Therapeutic activities;Patient/family education;Balance training OT Recommendation Follow Up Recommendations: Home health OT;Supervision - Intermittent Equipment Recommended:  (TBD- do not anticipate any needs) Individuals Consulted Consulted and Agree with Results and Recommendations: Patient;Family member/caregiver Family Member Consulted: wife and dtr OT Goals Acute Rehab OT Goals OT Goal Formulation: With patient/family Time For Goal Achievement: 7 days ADL Goals Pt Arthur Perform Grooming:  Independently;Standing at sink ADL Goal: Grooming - Progress: Goal set today Pt Arthur Perform Upper Body Bathing: with modified independence;Sit to stand in shower ADL Goal: Upper Body Bathing - Progress: Goal set today Pt Arthur Perform Lower Body Bathing: with modified independence;Sit to stand in shower ADL Goal: Lower Body Bathing - Progress: Goal set today Pt Arthur Perform Upper Body Dressing: Independently;Sitting, bed;Sitting, chair ADL Goal: Upper Body Dressing - Progress: Goal set today Pt Arthur Perform Lower Body Dressing: with modified independence;Sit to stand from chair;Sit to stand from bed;with adaptive equipment ADL Goal: Lower Body Dressing - Progress: Goal set today Pt Arthur Transfer to Toilet: with modified independence;Ambulation;with DME;Comfort height toilet ADL Goal: Toilet Transfer - Progress: Goal set today Pt Arthur Perform Toileting - Clothing Manipulation: Independently;Standing ADL Goal: Toileting - Clothing Manipulation - Progress: Goal set today Pt Arthur Perform Tub/Shower Transfer: Shower transfer;Ambulation;with DME;Shower seat with back ADL Goal: Web designer - Progress: Goal set today Additional ADL Goal #1: Pt Arthur I'ly verbalize/generalize 3/3 back precautions in prep for ADLs ADL Goal: Additional Goal #1 - Progress: Goal set today  OT Evaluation Precautions/Restrictions  Precautions Precautions: Back Precaution Booklet Issued: Yes (comment) Precaution Comments: pt educated on 3/3 back precautions Required Braces or Orthoses: Yes Spinal Brace: Lumbar corset Restrictions Weight Bearing Restrictions: No Prior Functioning Home Living Lives With: Spouse;Daughter Receives Help From: Family Type of Home: House Home Layout: One level Home Access: Stairs to enter Entrance Stairs-Rails: Left Entrance Stairs-Number of Steps: 5 Bathroom Shower/Tub: Psychologist, counselling;Door [employer] Toilet: Standard Bathroom Accessibility: Yes How Accessible: Accessible via  walker Home Adaptive Equipment: Walker - rolling;Built-in shower seat Prior Function Level of Independence: Independent with basic ADLs;Independent with homemaking with ambulation;Independent with gait;Independent with transfers Able to Take Stairs?: Yes Driving: Yes  Vocation: Retired Leisure: Hobbies-yes (Comment) Comments: landscaping, stripping medal ADL ADL Eating/Feeding: Not assessed Grooming: Simulated;Minimal assistance Where Assessed - Grooming: Standing at sink Upper Body Bathing: Simulated;Minimal assistance Where Assessed - Upper Body Bathing: Sitting, bed Lower Body Bathing: Simulated;Moderate assistance Where Assessed - Lower Body Bathing: Sit to stand from chair Upper Body Dressing: Moderate assistance;Performed Upper Body Dressing Details (indicate cue type and reason): gown Where Assessed - Upper Body Dressing: Sitting, bed Lower Body Dressing: Simulated;Maximal assistance Where Assessed - Lower Body Dressing: Sit to stand from bed Toilet Transfer: Simulated;Minimal assistance Toilet Transfer Details (indicate cue type and reason): simulated EOB to chair. Min A for sit to stand Toileting - Clothing Manipulation: Not assessed Toileting - Hygiene: Not assessed Tub/Shower Transfer: Not assessed Ambulation Related to ADLs: +2total A(pt=60%). Pt will do well with RW (not available this session).  ADL Comments: Educated pt and family on back precautions and their effect on ADLs. Did not review adaptive techniques or AE as I did not feel pt would remember these items secondary to PCA medication. Recommend education on these once pt off PCA Vision/Perception  Vision - History Baseline Vision: Wears glasses all the time Patient Visual Report: Blurring of vision (secondary pain medication?) Cognition Cognition Arousal/Alertness: Awake/alert Overall Cognitive Status: Appears within functional limits for tasks assessed Orientation Level: Oriented  X4 Sensation/Coordination Sensation Light Touch: Appears Intact (for UE) Coordination Gross Motor Movements are Fluid and Coordinated: Yes Fine Motor Movements are Fluid and Coordinated: Yes Extremity Assessment RUE Assessment RUE Assessment: Within Functional Limits LUE Assessment LUE Assessment: Within Functional Limits Mobility  Bed Mobility Rolling Right: 5: Supervision;With rail Right Sidelying to Sit: 4: Min assist;HOB flat;With rails End of Session OT - End of Session Equipment Utilized During Treatment: Back brace Activity Tolerance: Patient tolerated treatment well Patient left: in chair;with call bell in reach;with family/visitor present General Behavior During Session: [facility] for tasks performed   Shery Wauneka 07/19/2011, 11:07 AM

## 2011-07-19 NOTE — Plan of Care (Signed)
Problem: Consults Goal: Diagnosis - Spinal Surgery Thoraco/Lumbar Spine Fusion     

## 2011-07-19 NOTE — Progress Notes (Signed)
Physical Therapy Evaluation Patient Details Name: Arthur Orozco MRN: [id number] DOB: 08-21-1935 Today's Date: 07/19/2011  Problem List: There is no problem list on file for this patient.   Past Medical History:  Past Medical History  Diagnosis Date  . Pneumonia     HOSP. - [facility]- 2001  . GERD (gastroesophageal reflux disease)   . Arthritis     cerv., lumbar spondylosis   Past Surgical History:  Past Surgical History  Procedure Date  . Back surgery     lumbar- 1970's  . Shoulder open rotator cuff repair     right shoulder, following fall from truck    PT Assessment/Plan/Recommendation PT Assessment Clinical Impression Statement: Pt presents with a medical diagnosis of L5-S1 fusion along with the following impairments/deficits and therapy diagnosis listed below. Pt will benefit from skilled PT in the acute care setting in order to maximize functional mobility prior to d/c PT Recommendation/Assessment: Patient will need skilled PT in the acute care venue PT Problem List: Decreased strength;Decreased activity tolerance;Decreased mobility;Decreased knowledge of use of DME;Decreased knowledge of precautions;Pain PT Therapy Diagnosis : Difficulty walking;Acute pain PT Plan PT Frequency: Min 5X/week PT Treatment/Interventions: DME instruction;Gait training;Stair training;Functional mobility training;Therapeutic activities;Patient/family education PT Recommendation Follow Up Recommendations: Home health PT;Supervision/Assistance - 24 hour (HHPT TBD pending progress) Equipment Recommended: None recommended by PT (TBD) PT Goals  Acute Rehab PT Goals PT Goal Formulation: With patient Time For Goal Achievement: 7 days Pt will go Supine/Side to Sit: with modified independence PT Goal: Supine/Side to Sit - Progress: Goal set today Pt will go Sit to Supine/Side: with modified independence PT Goal: Sit to Supine/Side - Progress: Goal set today Pt will go Sit to Stand: with modified  independence PT Goal: Sit to Stand - Progress: Goal set today Pt will go Stand to Sit: with modified independence PT Goal: Stand to Sit - Progress: Goal set today Pt will Transfer Bed to Chair/Chair to Bed: with supervision PT Transfer Goal: Bed to Chair/Chair to Bed - Progress: Goal set today Pt will Ambulate: >150 feet;with supervision;with least restrictive assistive device PT Goal: Ambulate - Progress: Goal set today Pt will Go Up / Down Stairs: 3-5 stairs;with supervision;with rail(s) PT Goal: Up/Down Stairs - Progress: Goal set today  PT Evaluation Precautions/Restrictions  Precautions Precautions: Back Precaution Booklet Issued: Yes (comment) Precaution Comments: pt educated on 3/3 back precautions Required Braces or Orthoses: Yes Spinal Brace: Lumbar corset Restrictions Weight Bearing Restrictions: No Prior Functioning  Home Living Lives With: Spouse;Daughter Receives Help From: Family Type of Home: House Home Layout: One level Home Access: Stairs to enter Entrance Stairs-Rails: Left Entrance Stairs-Number of Steps: 5 Bathroom Shower/Tub: Psychologist, counselling;Door [employer] Toilet: Standard Bathroom Accessibility: Yes How Accessible: Accessible via walker Home Adaptive Equipment: Walker - rolling;Built-in shower seat Prior Function Level of Independence: Independent with basic ADLs;Independent with homemaking with ambulation;Independent with gait;Independent with transfers Able to Take Stairs?: Yes Driving: Yes Vocation: Retired Leisure: Hobbies-yes (Comment) Comments: landscaping, stripping Advertising account executive Arousal/Alertness: Awake/alert Overall Cognitive Status: Appears within functional limits for tasks assessed Orientation Level: Oriented X4 Sensation/Coordination Sensation Light Touch: Appears Intact (for UE) Coordination Gross Motor Movements are Fluid and Coordinated: Yes Fine Motor Movements are Fluid and Coordinated: Yes Extremity Assessment RUE  Assessment RUE Assessment: Within Functional Limits LUE Assessment LUE Assessment: Within Functional Limits RLE Assessment RLE Assessment: Exceptions to [facility] except hip flexion) RLE Strength Right Hip Flexion: 3/5 (pt unable to take any resistance secondary to pain) LLE Assessment LLE Assessment:  Exceptions to [facility] except hip flexion) LLE Strength Left Hip Flexion: 3/5 (unable to take any resistance secondary to pain) Mobility (including Balance) Bed Mobility Bed Mobility: Yes Rolling Right: 5: Supervision;With rail Right Sidelying to Sit: 4: Min assist;HOB flat;With rails Right Sidelying to Sit Details (indicate cue type and reason): VC for sequencing for safety to maintain back precautions Transfers Transfers: Yes Sit to Stand: 4: Min assist;With upper extremity assist;From bed Sit to Stand Details (indicate cue type and reason): VC for hand placement and safety with back precautions upon standing.  Stand to Sit: 4: Min assist;With upper extremity assist;To chair/3-in-1 Stand to Sit Details: VC for sequencing. Assist to control descent into chair Ambulation/Gait Ambulation/Gait: Yes Ambulation/Gait Assistance: 1: +2 Total assist;Patient percentage (comment) (60%) Ambulation/Gait Assistance Details (indicate cue type and reason): VC for sequencing throughout. Handheld assist by PT and OT. Pt relied heavily on support for safety and stability. VC for back precautions throughout session, especially during turning (Recommend RW next session) Ambulation Distance (Feet): 150 Feet Assistive device: 2 person hand held assist Gait Pattern: Step-to pattern;Decreased stride length;Decreased hip/knee flexion - right;Decreased hip/knee flexion - left;Decreased trunk rotation Gait velocity: decreased gait speed Stairs: No    Exercise    End of Session PT - End of Session Equipment Utilized During Treatment: Gait belt;Back brace Activity Tolerance: Patient tolerated treatment  well Patient left: in chair;with call bell in reach;with family/visitor present Nurse Communication: Mobility status for transfers;Mobility status for ambulation General Behavior During Session: [facility] for tasks performed Cognition: [facility] for tasks performed  Milana Kidney 07/19/2011, 12:36 PM  07/19/2011 Milana Kidney DPT PAGER: [phone] OFFICE: [phone]

## 2011-07-20 MED ORDER — HYDROMORPHONE HCL 2 MG PO TABS: 4.0000 mg | ORAL_TABLET | ORAL | Status: DC | PRN

## 2011-07-20 MED ORDER — OXYCODONE HCL 5 MG PO TABS: 15.0000 mg | ORAL_TABLET | ORAL | Status: DC | PRN

## 2011-07-20 MED ORDER — BISACODYL 10 MG RE SUPP
10.0000 mg | Freq: Every day | RECTAL | Status: DC | PRN
  Administered 2011-07-21: 10 mg via RECTAL
  Filled 2011-07-20: qty 1

## 2011-07-20 NOTE — Progress Notes (Signed)
Subjective: Patient reports doing well.  Objective: Vital signs in last 24 hours: Temp:  [97.6 F (36.4 C)-99.7 F (37.6 C)] 97.6 F (36.4 C) (03/23 0626) Pulse Rate:  [62-101] 70  (03/23 0626) Resp:  [14-20] 18  (03/23 0626) BP: (96-116)/(40-67) 99/46 mmHg (03/23 0626) SpO2:  [92 %-99 %] 97 % (03/23 0626) FiO2 (%):  [43 %] 43 % (03/22 2000)  Intake/Output from previous day: 03/22 0701 - 03/23 0700 In: 920 [P.O.:720] Out: 1595 [Urine:1125; Drains:470] Intake/Output this shift:    Physical Exam: Full strength bilateral lower extremities, Dressing CDI  Lab Results: No results found for this basename: WBC:2,HGB:2,HCT:2,PLT:2 in the last 72 hours BMET No results found for this basename: NA:2,K:2,CL:2,CO2:2,GLUCOSE:2,BUN:2,CREATININE:2,CALCIUM:2 in the last 72 hours  Studies/Results: Dg Lumbar Spine 2-3 Views  07/18/2011  *RADIOLOGY REPORT*  Clinical Data: L2-S1 fusion  LUMBAR SPINE - 2-3 VIEW  Comparison: Intraoperative radiographs 07/18/2011.  Findings: Bilateral pedicle screws are present at L2, L3, L4, L5, and S1.  Alignment is stable.  The screws appear well placed. Graft material is noted at the intervening disc spaces. A wide laminectomy has been performed.  IMPRESSION:  1.  Interval discectomy, wide laminectomy, and intervertebral graft material from L2-S1. 2.  No radiographic evidence for complication.  Original Report Authenticated By: Jamesetta Orleans. MATTERN, M.D.   Dg Lumbar Spine 2-3 Views  07/18/2011  *RADIOLOGY REPORT*  Clinical Data: Back pain  LUMBAR SPINE - 2-3 VIEW  Comparison: None.  Findings: Film #1 demonstrates probes directed most closely at the L3, L4, and L5 pedicles.  Film #2 demonstrates  probes directed most closely at the L2, L3, L4, and L5 pedicles.  IMPRESSION: As above.  Original Report Authenticated By: Elsie Stain, M.D.    Assessment/Plan: Drain still putting out 400cc last 24h.  Will leave in place today.  Mobilize with PT.    LOS: 2 days     Dorian Heckle, MD 07/20/2011, 7:27 AM

## 2011-07-20 NOTE — Progress Notes (Addendum)
3.3 mg morphine wasted from patient's PCA at 1030 this morning. Waste witnessed by charge nurse Earline Stiner Joselyn Glassman  Minor, Yvette Rack Witnessed by Vanna Scotland

## 2011-07-20 NOTE — Progress Notes (Signed)
Occupational Therapy Treatment Patient Details Name: Arthur Orozco MRN: [id number] DOB: [birth year] Today's Date: 07/20/2011  OT Assessment/Plan OT Assessment/Plan OT Frequency: Min 2X/week OT Goals Acute Rehab OT Goals Time For Goal Achievement: 7 days ADL Goals ADL Goal: Grooming - Progress: Progressing toward goals ADL Goal: Upper Body Dressing - Progress: Progressing toward goals ADL Goal: Additional Goal #1 - Progress: Progressing toward goals  OT Treatment Precautions/Restrictions  Precautions Precautions: Back Precaution Comments: pt. able to state 3/3 back precautions Required Braces or Orthoses: Yes Spinal Brace: Lumbar corset Restrictions Weight Bearing Restrictions: No   ADL ADL Grooming: Performed;Wash/dry hands;Supervision/safety Grooming Details (indicate cue type and reason): demonstrational and inst. cues for walker placement at the counter Where Assessed - Grooming: Standing at sink Upper Body Dressing: Performed;Minimal assistance Upper Body Dressing Details (indicate cue type and reason): doffed back brace Where Assessed - Upper Body Dressing: Sitting, bed;Unsupported Lower Body Dressing:  (pt. and pts. wife state she will assist with LB dressing) Ambulation Related to ADLs: close S with assistance for IV management  ADL Comments: pt. and pts. wife able to state 3/3 back precautions with out cues. also both state they will not need LB A/E  Mobility    Exercises    End of Session OT - End of Session Equipment Utilized During Treatment: Back brace Activity Tolerance: Patient tolerated treatment well Patient left: in bed;with call bell in reach;with family/visitor present General Behavior During Session: [facility] for tasks performed Cognition: [facility] for tasks performed  Robet Leu  COTA/L 07/20/2011, 11:48 AM

## 2011-07-20 NOTE — Progress Notes (Signed)
Physical Therapy Treatment Patient Details Name: Arthur Orozco MRN: [id number] DOB: May 11, 1935 Today's Date: 07/20/2011  PT Assessment/Plan  PT - Assessment/Plan Comments on Treatment Session: Pt progressing well, less lethargic this session. Pt with improved gait with RW. Continue per plan. Complete stairs next session. PT Plan: Discharge plan remains appropriate;Frequency remains appropriate PT Frequency: Min 5X/week Follow Up Recommendations: Home health PT;Supervision/Assistance - 24 hour Equipment Recommended: None recommended by PT PT Goals  Acute Rehab PT Goals PT Goal Formulation: With patient PT Goal: Sit to Stand - Progress: Progressing toward goal PT Goal: Stand to Sit - Progress: Progressing toward goal PT Transfer Goal: Bed to Chair/Chair to Bed - Progress: Progressing toward goal PT Goal: Ambulate - Progress: Progressing toward goal  PT Treatment Precautions/Restrictions  Precautions Precautions: Back Precaution Booklet Issued: Yes (comment) Precaution Comments: Pt able to recall 3/3 back precautions. Minimal cueing throughout treatment to maintain back precautions Required Braces or Orthoses: Yes Spinal Brace: Lumbar corset Restrictions Weight Bearing Restrictions: No Mobility (including Balance) Bed Mobility Bed Mobility: No Transfers Transfers: Yes Sit to Stand: 5: Supervision;With upper extremity assist;From chair/3-in-1 Sit to Stand Details (indicate cue type and reason): VC for hand placement and sequencing to maintain back precautions Stand to Sit: 5: Supervision;With upper extremity assist;To chair/3-in-1 Stand to Sit Details: VC for controlled descent and hand placement Ambulation/Gait Ambulation/Gait: Yes Ambulation/Gait Assistance: 4: Min assist (Minguard assist) Ambulation/Gait Assistance Details (indicate cue type and reason): Minguard assist for safety. VC for distance to RW and proper sequencing Ambulation Distance (Feet): 150 Feet Assistive  device: Rolling walker Gait Pattern: Step-to pattern;Decreased stride length;Decreased hip/knee flexion - right;Decreased hip/knee flexion - left;Decreased trunk rotation Gait velocity: decreased gait speed Stairs: No    Exercise    End of Session PT - End of Session Equipment Utilized During Treatment: Gait belt;Back brace Activity Tolerance: Patient tolerated treatment well Patient left: in chair;with call bell in reach Nurse Communication: Mobility status for transfers;Mobility status for ambulation General Behavior During Session: [facility] for tasks performed Cognition: [facility] for tasks performed  Milana Kidney 07/20/2011, 12:41 PM  07/20/2011 Milana Kidney DPT PAGER: [phone] OFFICE: [phone]

## 2011-07-21 MED ORDER — OXYCODONE-ACETAMINOPHEN 5-325 MG PO TABS: 1.0000 | ORAL_TABLET | ORAL | Status: AC | PRN

## 2011-07-21 NOTE — Progress Notes (Signed)
Subjective: Patient reports doing well.  Wants to go home.  Objective: Vital signs in last 24 hours: Temp:  [98.1 F (36.7 C)-99.6 F (37.6 C)] 98.5 F (36.9 C) (03/24 0233) Pulse Rate:  [66-71] 66  (03/24 0233) Resp:  [16-18] 18  (03/24 0233) BP: (99-111)/(50-62) 110/62 mmHg (03/24 0233) SpO2:  [91 %-98 %] 94 % (03/24 0233)  Intake/Output from previous day: 03/23 0701 - 03/24 0700 In: 1200 [P.O.:1200] Out: 840 [Urine:750; Drains:90] Intake/Output this shift:    Physical Exam: Doing well.  Dressing CDI.  Full strength bilateral lower extremities.  No pain or numbness.  Lab Results: No results found for this basename: WBC:2,HGB:2,HCT:2,PLT:2 in the last 72 hours BMET No results found for this basename: NA:2,K:2,CL:2,CO2:2,GLUCOSE:2,BUN:2,CREATININE:2,CALCIUM:2 in the last 72 hours  Studies/Results: No results found.  Assessment/Plan: D/C drain.  D/C home.    LOS: 3 days    Dorian Heckle, MD 07/21/2011, 7:26 AM

## 2011-07-21 NOTE — Progress Notes (Signed)
Discharged to home. Transported to car via wheelchair.

## 2011-07-21 NOTE — Discharge Summary (Signed)
Physician Discharge Summary  Patient ID: GEDALYA JIM MRN: [id number] DOB/AGE: [birth year] 76 y.o.  Admit date: 07/18/2011 Discharge date: 07/21/2011  Admission Diagnoses:Scoliosis, spondylolisthesis, stenosis, HNP, DDD, radiculopathy L23, L34, L45, L5S1  Discharge Diagnoses: Scoliosis, spondylolisthesis, stenosis, HNP, DDD, radiculopathy L23, L34, L45, L5S1 Active Problems:  * No active hospital problems. *    Discharged Condition: good  Hospital Course: Uncomplicated decompression and fusion L2/3, L3/4, L4/5, L5/S1  Consults: None  Significant Diagnostic Studies: None  Treatments: surgery:Uncomplicated decompression and fusion L2/3, L3/4, L4/5, L5/S1  Discharge Exam: Blood pressure 126/63, pulse 71, temperature 98.5 F (36.9 C), temperature source Oral, resp. rate 16, height 5\' 6"  (1.676 m), weight 78.926 kg (174 lb), SpO2 91.00%. Neurologic: Alert and oriented X 3, normal strength and tone. Normal symmetric reflexes. Normal coordination and gait Wound:CDI  Disposition: Home   Medication List  As of 07/21/2011  9:06 AM   STOP taking these medications         ibuprofen 200 MG tablet         TAKE these medications         aspirin EC 81 MG tablet   Take 81 mg by mouth daily.      fish oil-omega-3 fatty acids 1000 MG capsule   Take 1 g by mouth daily.      oxyCODONE-acetaminophen 5-325 MG per tablet   Commonly known as: PERCOCET   Take 1-2 tablets by mouth every 4 (four) hours as needed for pain.      ranitidine 150 MG tablet   Commonly known as: ZANTAC   Take 150 mg by mouth 2 (two) times daily as needed. For acid reflux      STOOL SOFTENER PO   Take 2 capsules by mouth daily as needed. For constipation      vitamin B-12 1000 MCG tablet   Commonly known as: CYANOCOBALAMIN   Take 1,000 mcg by mouth daily.      Vitamin D3 2000 UNITS Tabs   Take 1 tablet by mouth daily.      Zinc 50 MG Caps   Take 1 capsule by mouth daily.              Signed: Dorian Heckle, MD 07/21/2011, 9:06 AM

## 2011-07-21 NOTE — Progress Notes (Signed)
Physical Therapy Treatment Patient Details Name: Arthur Orozco MRN: [id number] DOB: 1935/10/29 Today's Date: 07/21/2011  PT Assessment/Plan  PT - Assessment/Plan Comments on Treatment Session: Pt progressing. Pt d/c'ing today, completed stairs and mod I mobility with all transfers.  PT Plan: Discharge plan remains appropriate;Frequency remains appropriate PT Frequency: Min 5X/week Follow Up Recommendations: Home health PT;Supervision/Assistance - 24 hour Equipment Recommended: None recommended by PT PT Goals  Acute Rehab PT Goals PT Goal Formulation: With patient PT Goal: Sit to Stand - Progress: Met PT Goal: Stand to Sit - Progress: Met PT Transfer Goal: Bed to Chair/Chair to Bed - Progress: Met PT Goal: Ambulate - Progress: Met PT Goal: Up/Down Stairs - Progress: Progressing toward goal  PT Treatment Precautions/Restrictions  Precautions Precautions: Back Precaution Booklet Issued: Yes (comment) Precaution Comments: Pt able to recall 3/3 back precautions. Minimal cueing throughout treatment to maintain back precautions Required Braces or Orthoses: Yes Spinal Brace: Lumbar corset Restrictions Weight Bearing Restrictions: No Mobility (including Balance) Bed Mobility Bed Mobility: No Transfers Transfers: Yes Sit to Stand: 6: Modified independent (Device/Increase time) Stand to Sit: 6: Modified independent (Device/Increase time) Ambulation/Gait Ambulation/Gait: Yes Ambulation/Gait Assistance: 6: Modified independent (Device/Increase time) Ambulation Distance (Feet): 200 Feet Assistive device: Rolling walker Gait Pattern: Step-to pattern;Decreased stride length;Decreased hip/knee flexion - right;Decreased hip/knee flexion - left;Decreased trunk rotation Gait velocity: decreased gait speed Stairs: Yes Stairs Assistance: 4: Min assist Stairs Assistance Details (indicate cue type and reason): Handheld assist on R and L railing. VC for proper sequencing and safety. Pt completed  second half of stairs with wife giving min assist with hand held assist. Stair Management Technique: One rail Left;Other (comment);Forwards;Step to pattern (1 HHA) Number of Stairs: 5     Exercise    End of Session PT - End of Session Equipment Utilized During Treatment: Gait belt;Back brace Activity Tolerance: Patient tolerated treatment well Patient left: in chair;with call bell in reach Nurse Communication: Mobility status for transfers;Mobility status for ambulation General Behavior During Session: [facility] for tasks performed Cognition: [facility] for tasks performed  Milana Kidney 07/21/2011, 1:04 PM  07/21/2011 Milana Kidney DPT PAGER: [phone] OFFICE: [phone]

## 2011-07-22 MED FILL — Heparin Sodium (Porcine) Inj 1000 Unit/ML: INTRAMUSCULAR | Qty: 30 | Status: AC

## 2011-07-22 MED FILL — Sodium Chloride IV Soln 0.9%: INTRAVENOUS | Qty: 1000 | Status: AC

## 2011-07-22 MED FILL — Sodium Chloride Irrigation Soln 0.9%: Qty: 3000 | Status: AC

## 2013-10-20 ENCOUNTER — Ambulatory Visit: Payer: Self-pay | Admitting: Family Medicine

## 2016-04-26 ENCOUNTER — Ambulatory Visit
Admission: RE | Admit: 2016-04-26 | Discharge: 2016-04-26 | Disposition: A | Discharge status: Home / Self Care | Payer: Medicare Other | Source: Ambulatory Visit | Attending: Medical Oncology | Admitting: Medical Oncology

## 2016-04-26 ENCOUNTER — Other Ambulatory Visit: Payer: Self-pay | Admitting: Medical Oncology

## 2016-04-26 DIAGNOSIS — Z87891 Personal history of nicotine dependence: Secondary | ICD-10-CM

## 2016-04-26 DIAGNOSIS — R05 Cough: Secondary | ICD-10-CM

## 2016-04-26 DIAGNOSIS — R058 Other specified cough: Secondary | ICD-10-CM

## 2016-04-26 DIAGNOSIS — R0989 Other specified symptoms and signs involving the circulatory and respiratory systems: Secondary | ICD-10-CM

## 2016-04-26 DIAGNOSIS — R6889 Other general symptoms and signs: Secondary | ICD-10-CM

## 2016-04-28 ENCOUNTER — Ambulatory Visit (INDEPENDENT_AMBULATORY_CARE_PROVIDER_SITE_OTHER): Payer: Medicare Other

## 2016-04-28 ENCOUNTER — Encounter: Payer: Self-pay | Admitting: Gynecology

## 2016-04-28 ENCOUNTER — Ambulatory Visit
Admission: EM | Admit: 2016-04-28 | Discharge: 2016-04-28 | Disposition: A | Discharge status: Home / Self Care | Payer: Medicare Other | Attending: Family Medicine | Admitting: Family Medicine

## 2016-04-28 DIAGNOSIS — J101 Influenza due to other identified influenza virus with other respiratory manifestations: Secondary | ICD-10-CM

## 2016-04-28 LAB — RAPID STREP SCREEN (MED CTR MEBANE ONLY): Streptococcus, Group A Screen (Direct): NEGATIVE

## 2016-04-28 MED ORDER — ALBUTEROL SULFATE HFA 108 (90 BASE) MCG/ACT IN AERS: 2.0000 | INHALATION_SPRAY | RESPIRATORY_TRACT | 0 refills | Status: DC | PRN

## 2016-04-28 MED ORDER — LEVOFLOXACIN 750 MG PO TABS: 750.0000 mg | ORAL_TABLET | Freq: Every day | ORAL | 0 refills | Status: AC

## 2016-04-28 NOTE — ED Provider Notes (Signed)
CSN: [id number]     Arrival date & time 04/28/16  [id number] History   First MD Initiated Contact with Patient 04/28/16 1024     Chief Complaint  Patient presents with  . Influenza   (Consider location/radiation/quality/duration/timing/severity/associated sxs/prior Treatment) Patient is a 80 year old male, overall healthy, brought in by wife today for re-evaluation. Wife reports the patient has been sick with flu-like symptoms for about 2 weeks. Patient was seen [facility] at [facility], had a positive influenza A and was sent to the our urgent care for chest x-ray. Wife states that they have not heard anything back about the chest x-ray.   At [facility], patient was given a Rocephin shot, was sent home with doxycycline and Tessalon Perle, and Tamiflu. Patient could not afford the Tamiflu therefore did not start the Tamiflu. Patient did start on doxycycline but did not take the antibiotic this morning.   Patient reports that he woke up this morning with his lymph nodes very swollen. He denies sore throat. No fever at home but patient has been having chills and sweating. Patient denies shortness of breath. Wife does endorses wheezing at home. Oxygen saturation was 91% at [facility] and it's 93% at urgent care today.         Past Medical History:  Diagnosis Date  . Arthritis    cerv., lumbar spondylosis  . GERD (gastroesophageal reflux disease)   . Pneumonia    HOSP. - [facility]- 2001   Past Surgical History:  Procedure Laterality Date  . BACK SURGERY     lumbar- 1970's  . SHOULDER OPEN ROTATOR CUFF REPAIR     right shoulder, following fall from truck   Family History  Problem Relation Age of Onset  . Anesthesia problems Neg Hx   . Hypotension Neg Hx   . Malignant hyperthermia Neg Hx   . Pseudochol deficiency Neg Hx    Social History  Substance Use Topics  . Smoking status: Former Smoker    Quit date: 07/10/1978  . Smokeless tobacco: Never Used  . Alcohol  use Yes     Comment: holidays, socially     Review of Systems  Constitutional: Positive for appetite change, chills and fatigue. Negative for fever.  HENT: Positive for congestion. Negative for ear pain, sinus pain, sinus pressure and sore throat.   Respiratory: Positive for wheezing. Negative for shortness of breath.   Cardiovascular: Negative for chest pain and palpitations.  Gastrointestinal: Negative for abdominal pain and nausea.  Musculoskeletal: Positive for myalgias.  Neurological: Negative for dizziness and headaches.    Allergies  Patient has no known allergies.  Home Medications   Prior to Admission medications   Medication Sig Start Date End Date Taking? Authorizing Provider  aspirin EC 81 MG tablet Take 81 mg by mouth daily.   Yes Historical Provider, MD  benzonatate (TESSALON) 200 MG capsule Take 200 mg by mouth 3 (three) times daily as needed for cough.   Yes Historical Provider, MD  Cholecalciferol (VITAMIN D3) 2000 UNITS TABS Take 1 tablet by mouth daily.   Yes Historical Provider, MD  Docusate Calcium (STOOL SOFTENER PO) Take 2 capsules by mouth daily as needed. For constipation   Yes Historical Provider, MD  doxycycline (DORYX) 100 MG EC tablet Take 100 mg by mouth 2 (two) times daily.   Yes Historical Provider, MD  fish oil-omega-3 fatty acids 1000 MG capsule Take 1 g by mouth daily.   Yes Historical Provider, MD  oseltamivir (  TAMIFLU) 75 MG capsule Take 75 mg by mouth.   Yes Historical Provider, MD  ranitidine (ZANTAC) 150 MG tablet Take 150 mg by mouth 2 (two) times daily as needed. For acid reflux   Yes Historical Provider, MD  vitamin B-12 (CYANOCOBALAMIN) 1000 MCG tablet Take 1,000 mcg by mouth daily.   Yes Historical Provider, MD  Zinc 50 MG CAPS Take 1 capsule by mouth daily.   Yes Historical Provider, MD  albuterol (PROVENTIL HFA;VENTOLIN HFA) 108 (90 Base) MCG/ACT inhaler Inhale 2 puffs into the lungs every 4 (four) hours as needed for wheezing or shortness  of breath. 04/28/16   Lucia EstelleFeng Dru Primeau, NP  levofloxacin (LEVAQUIN) 750 MG tablet Take 1 tablet (750 mg total) by mouth daily. 04/28/16 05/03/16  Lucia EstelleFeng Lawrie Tunks, NP   Meds Ordered and Administered this Visit  Medications - No data to display  BP 135/70 (BP Location: Left Arm)   Pulse 77   Temp 98.2 F (36.8 C) (Oral)   Resp 16   Wt 165 lb (74.8 kg)   SpO2 93%   BMI 26.63 kg/m  No data found.   Physical Exam  Constitutional: He is oriented to person, place, and time.  No distress  HENT:  Head: Normocephalic and atraumatic.  Right Ear: External ear normal.  Left Ear: External ear normal.  Nose: Nose normal.  Mouth/Throat: Oropharynx is clear and moist. No oropharyngeal exudate.  TM pearly gray with no erythema. Pharynx unremarkable.   Eyes: Pupils are equal, round, and reactive to light.  Neck:  + bilateral anterior cervical lymphadenopathy; left worse than right   Cardiovascular: Normal rate, regular rhythm and normal heart sounds.   Pulmonary/Chest: Effort normal.  Has diffuse wheezes and rhonchi  Abdominal: Soft. Bowel sounds are normal. He exhibits no distension. There is no tenderness.  Neurological: He is alert and oriented to person, place, and time.  Skin: Skin is warm and dry.  Nursing note and vitals reviewed.   Urgent Care Course   Clinical Course     Procedures (including critical care time)  Labs Review Labs Reviewed  RAPID STREP SCREEN (NOT AT [facility])  CULTURE, GROUP A STREP [facility])    Imaging Review Dg Chest 2 View  Result Date: 04/28/2016 CLINICAL DATA:  Pt states he has cough congestion weakness for 3 weeks, woke up this morning with swelling of throat. Was [facility] at [facility] Friday but symptoms have not gotten any better. History of pneumonia in 2001. EXAM: CHEST  2 VIEW COMPARISON:  04/26/2016 FINDINGS: Cardiac silhouette is normal in size and configuration. No mediastinal or hilar masses. No evidence of adenopathy. Clear lungs.  No pleural effusion.   No pneumothorax. Skeletal structures are demineralized but intact. IMPRESSION: No active cardiopulmonary disease. Electronically Signed   By: Amie Portlandavid  Ormond M.D.   On: 04/28/2016 10:51   Dg Chest 2 View  Result Date: 04/26/2016 CLINICAL DATA:  Rhonchi and cough EXAM: CHEST  2 VIEW COMPARISON:  None. FINDINGS: Normal heart size and mediastinal contours. No acute infiltrate or edema. No effusion or pneumothorax. Spondylosis and lumbar fixation that is partly seen. No acute osseous findings. IMPRESSION: No evidence of active disease. Electronically Signed   By: Marnee SpringJonathon  Watts M.D.   On: 04/26/2016 11:35    MDM   1. Influenza A    Patient had a confirmed influenza A on 12/29 at [facility]. Patient could not afford Tamiflu. Patient and wife both informed to continue to treat symptomatically.   There is  a high suspicion for CAP despite repeated negative chest x-ray. Will switch patient to Levaquin 750 mg PO daily x 5 days. Albuterol inhaler also given to use PRN for wheezing and shortness of breath. Patient has appt scheduled with PCP on Tuesday, informed to keep this appt and f/u as scheduled for re-evaluation.   Overall, I do feel that patient is safe to be discharged home. ER precaution was discussed.     Lucia Estelle, NP 04/28/16 1131

## 2016-04-28 NOTE — ED Triage Notes (Signed)
Patient was seen x 2 days ago dx with the Flu. Per patient did not pick up his Rx. Tamiflu was too expensive. Patient was also given antibiotic and cough medication which he did not take this morning. Per patient did not received result from chest xray x 2 days ago. Patient stated lymph node feel swollen.

## 2016-05-01 LAB — CULTURE, GROUP A STREP (THRC)

## 2019-04-15 ENCOUNTER — Emergency Department: Payer: Medicare Other

## 2019-04-15 ENCOUNTER — Other Ambulatory Visit: Payer: Self-pay

## 2019-04-15 ENCOUNTER — Encounter: Payer: Self-pay | Admitting: Emergency Medicine

## 2019-04-15 ENCOUNTER — Ambulatory Visit (INDEPENDENT_AMBULATORY_CARE_PROVIDER_SITE_OTHER)
Admission: EM | Admit: 2019-04-15 | Discharge: 2019-04-15 | Disposition: A | Discharge status: Home / Self Care | Payer: Medicare Other | Source: Home / Self Care | Attending: Family Medicine | Admitting: Family Medicine

## 2019-04-15 ENCOUNTER — Inpatient Hospital Stay
Admission: EM | Admit: 2019-04-15 | Discharge: 2019-04-17 | DRG: 177 | Disposition: A | Discharge status: Other Acute Inpatient Hospital | Payer: Medicare Other | Attending: Family Medicine | Admitting: Family Medicine

## 2019-04-15 ENCOUNTER — Ambulatory Visit: Payer: Medicare Other

## 2019-04-15 DIAGNOSIS — U071 COVID-19: Secondary | ICD-10-CM | POA: Diagnosis present

## 2019-04-15 DIAGNOSIS — J1282 Pneumonia due to coronavirus disease 2019: Secondary | ICD-10-CM | POA: Diagnosis present

## 2019-04-15 DIAGNOSIS — J9601 Acute respiratory failure with hypoxia: Secondary | ICD-10-CM | POA: Diagnosis present

## 2019-04-15 DIAGNOSIS — Z87891 Personal history of nicotine dependence: Secondary | ICD-10-CM

## 2019-04-15 DIAGNOSIS — Z8701 Personal history of pneumonia (recurrent): Secondary | ICD-10-CM

## 2019-04-15 DIAGNOSIS — R197 Diarrhea, unspecified: Secondary | ICD-10-CM | POA: Diagnosis present

## 2019-04-15 DIAGNOSIS — E871 Hypo-osmolality and hyponatremia: Secondary | ICD-10-CM | POA: Diagnosis present

## 2019-04-15 DIAGNOSIS — Z881 Allergy status to other antibiotic agents status: Secondary | ICD-10-CM | POA: Diagnosis not present

## 2019-04-15 DIAGNOSIS — M47816 Spondylosis without myelopathy or radiculopathy, lumbar region: Secondary | ICD-10-CM | POA: Diagnosis present

## 2019-04-15 DIAGNOSIS — Z20822 Contact with and (suspected) exposure to covid-19: Secondary | ICD-10-CM

## 2019-04-15 DIAGNOSIS — K219 Gastro-esophageal reflux disease without esophagitis: Secondary | ICD-10-CM | POA: Diagnosis present

## 2019-04-15 DIAGNOSIS — M47812 Spondylosis without myelopathy or radiculopathy, cervical region: Secondary | ICD-10-CM | POA: Diagnosis present

## 2019-04-15 DIAGNOSIS — Z79899 Other long term (current) drug therapy: Secondary | ICD-10-CM | POA: Diagnosis not present

## 2019-04-15 DIAGNOSIS — J1289 Other viral pneumonia: Secondary | ICD-10-CM | POA: Diagnosis present

## 2019-04-15 DIAGNOSIS — Z20828 Contact with and (suspected) exposure to other viral communicable diseases: Secondary | ICD-10-CM

## 2019-04-15 LAB — CBC WITH DIFFERENTIAL/PLATELET
Abs Immature Granulocytes: 0.03 10*3/uL (ref 0.00–0.07)
Basophils Absolute: 0 10*3/uL (ref 0.0–0.1)
Basophils Relative: 0 %
Eosinophils Absolute: 0 10*3/uL (ref 0.0–0.5)
Eosinophils Relative: 0 %
HCT: 35.2 % — ABNORMAL LOW (ref 39.0–52.0)
Hemoglobin: 12.4 g/dL — ABNORMAL LOW (ref 13.0–17.0)
Immature Granulocytes: 0 %
Lymphocytes Relative: 5 %
Lymphs Abs: 0.4 10*3/uL — ABNORMAL LOW (ref 0.7–4.0)
MCH: 30.8 pg (ref 26.0–34.0)
MCHC: 35.2 g/dL (ref 30.0–36.0)
MCV: 87.6 fL (ref 80.0–100.0)
Monocytes Absolute: 0.4 10*3/uL (ref 0.1–1.0)
Monocytes Relative: 4 %
Neutro Abs: 7.4 10*3/uL (ref 1.7–7.7)
Neutrophils Relative %: 91 %
Platelets: 239 10*3/uL (ref 150–400)
RBC: 4.02 MIL/uL — ABNORMAL LOW (ref 4.22–5.81)
RDW: 11.9 % (ref 11.5–15.5)
WBC: 8.2 10*3/uL (ref 4.0–10.5)
nRBC: 0 % (ref 0.0–0.2)

## 2019-04-15 LAB — LACTATE DEHYDROGENASE: LDH: 426 U/L — ABNORMAL HIGH (ref 98–192)

## 2019-04-15 LAB — COMPREHENSIVE METABOLIC PANEL
ALT: 22 U/L (ref 0–44)
AST: 43 U/L — ABNORMAL HIGH (ref 15–41)
Albumin: 3.4 g/dL — ABNORMAL LOW (ref 3.5–5.0)
Alkaline Phosphatase: 53 U/L (ref 38–126)
Anion gap: 12 (ref 5–15)
BUN: 20 mg/dL (ref 8–23)
CO2: 22 mmol/L (ref 22–32)
Calcium: 7.8 mg/dL — ABNORMAL LOW (ref 8.9–10.3)
Chloride: 96 mmol/L — ABNORMAL LOW (ref 98–111)
Creatinine, Ser: 1.09 mg/dL (ref 0.61–1.24)
GFR calc Af Amer: >60 mL/min (ref >60)
GFR calc non Af Amer: >60 mL/min (ref >60)
Glucose, Bld: 127 mg/dL — ABNORMAL HIGH (ref 70–99)
Potassium: 3.8 mmol/L (ref 3.5–5.1)
Sodium: 130 mmol/L — ABNORMAL LOW (ref 135–145)
Total Bilirubin: 1.5 mg/dL — ABNORMAL HIGH (ref 0.3–1.2)
Total Protein: 7.4 g/dL (ref 6.5–8.1)

## 2019-04-15 LAB — INFLUENZA PANEL BY PCR (TYPE A & B)
Influenza A By PCR: NEGATIVE
Influenza B By PCR: NEGATIVE

## 2019-04-15 LAB — C-REACTIVE PROTEIN: CRP: 19.3 mg/dL — ABNORMAL HIGH (ref <1.0)

## 2019-04-15 LAB — TYPE AND SCREEN
ABO/RH(D): O POS
Antibody Screen: NEGATIVE

## 2019-04-15 LAB — LACTIC ACID, PLASMA
Lactic Acid, Venous: 1.4 mmol/L (ref 0.5–1.9)
Lactic Acid, Venous: 1.2 mmol/L (ref 0.5–1.9)

## 2019-04-15 LAB — FERRITIN: Ferritin: 650 ng/mL — ABNORMAL HIGH (ref 24–336)

## 2019-04-15 LAB — PROCALCITONIN: Procalcitonin: 0.23 ng/mL

## 2019-04-15 LAB — HEPATITIS B SURFACE ANTIGEN: Hepatitis B Surface Ag: NONREACTIVE

## 2019-04-15 LAB — BRAIN NATRIURETIC PEPTIDE: B Natriuretic Peptide: 139 pg/mL — ABNORMAL HIGH (ref 0.0–100.0)

## 2019-04-15 LAB — ABO/RH: ABO/RH(D): O POS

## 2019-04-15 LAB — FIBRINOGEN: Fibrinogen: 692 mg/dL — ABNORMAL HIGH (ref 210–475)

## 2019-04-15 LAB — POC SARS CORONAVIRUS 2 AG: SARS Coronavirus 2 Ag: POSITIVE — AB

## 2019-04-15 LAB — TRIGLYCERIDES: Triglycerides: 77 mg/dL (ref <150)

## 2019-04-15 LAB — FIBRIN DERIVATIVES D-DIMER (ARMC ONLY): Fibrin derivatives D-dimer (ARMC): 3166.97 ng/mL (FEU) — ABNORMAL HIGH (ref 0.00–499.00)

## 2019-04-15 MED ORDER — ALBUTEROL SULFATE HFA 108 (90 BASE) MCG/ACT IN AERS
2.0000 | INHALATION_SPRAY | RESPIRATORY_TRACT | Status: DC | PRN
  Filled 2019-04-15: qty 6.7

## 2019-04-15 MED ORDER — CALCIUM CARBONATE 1250 (500 CA) MG PO TABS
1.0000 | ORAL_TABLET | Freq: Every day | ORAL | Status: DC
  Filled 2019-04-15 (×2): qty 1

## 2019-04-15 MED ORDER — VITAMIN D 25 MCG (1000 UNIT) PO TABS: 1000.0000 [IU] | ORAL_TABLET | Freq: Every day | ORAL | Status: DC

## 2019-04-15 MED ORDER — SODIUM CHLORIDE 0.9 % IV SOLN
100.0000 mg | Freq: Every day | INTRAVENOUS | Status: DC
  Administered 2019-04-16: 09:00:00 100 mg via INTRAVENOUS
  Filled 2019-04-15: qty 20
  Filled 2019-04-15: qty 100

## 2019-04-15 MED ORDER — PANTOPRAZOLE SODIUM 40 MG PO TBEC
40.0000 mg | DELAYED_RELEASE_TABLET | Freq: Every day | ORAL | Status: DC | PRN
  Administered 2019-04-16: 40 mg via ORAL
  Filled 2019-04-15: qty 1

## 2019-04-15 MED ORDER — ACETAMINOPHEN 325 MG PO TABS: 650.0000 mg | ORAL_TABLET | Freq: Four times a day (QID) | ORAL | Status: DC | PRN

## 2019-04-15 MED ORDER — METHYLPREDNISOLONE SODIUM SUCC 40 MG IJ SOLR
40.0000 mg | Freq: Two times a day (BID) | INTRAMUSCULAR | Status: DC
  Administered 2019-04-15 – 2019-04-16 (×2): 40 mg via INTRAVENOUS
  Filled 2019-04-15 (×2): qty 1

## 2019-04-15 MED ORDER — ELDERBERRY 575 MG/5ML PO SYRP: 1.0000 | ORAL_SOLUTION | Freq: Every day | ORAL | Status: DC

## 2019-04-15 MED ORDER — SODIUM CHLORIDE 0.9 % IV SOLN
200.0000 mg | Freq: Once | INTRAVENOUS | Status: AC
  Administered 2019-04-15: 200 mg via INTRAVENOUS
  Filled 2019-04-15: qty 200

## 2019-04-15 MED ORDER — IPRATROPIUM BROMIDE HFA 17 MCG/ACT IN AERS
2.0000 | INHALATION_SPRAY | RESPIRATORY_TRACT | Status: DC
  Administered 2019-04-16 (×2): 2 via RESPIRATORY_TRACT
  Filled 2019-04-15 (×3): qty 12.9

## 2019-04-15 MED ORDER — DM-GUAIFENESIN ER 30-600 MG PO TB12: 1.0000 | ORAL_TABLET | Freq: Two times a day (BID) | ORAL | Status: DC

## 2019-04-15 MED ORDER — ZINC SULFATE 220 (50 ZN) MG PO CAPS
220.0000 mg | ORAL_CAPSULE | Freq: Every day | ORAL | Status: DC
  Administered 2019-04-16: 220 mg via ORAL
  Filled 2019-04-15 (×2): qty 1

## 2019-04-15 MED ORDER — ASCORBIC ACID 500 MG PO TABS
500.0000 mg | ORAL_TABLET | Freq: Every day | ORAL | Status: DC
  Administered 2019-04-16 (×2): 500 mg via ORAL
  Filled 2019-04-15 (×3): qty 1

## 2019-04-15 MED ORDER — ONDANSETRON HCL 4 MG/2ML IJ SOLN: 4.0000 mg | Freq: Three times a day (TID) | INTRAMUSCULAR | Status: DC | PRN

## 2019-04-15 MED ORDER — GUAIFENESIN-DM 100-10 MG/5ML PO SYRP
5.0000 mL | ORAL_SOLUTION | Freq: Two times a day (BID) | ORAL | Status: DC
  Administered 2019-04-16 (×3): 5 mL via ORAL
  Filled 2019-04-15 (×4): qty 5

## 2019-04-15 MED ORDER — ENOXAPARIN SODIUM 40 MG/0.4ML ~~LOC~~ SOLN
40.0000 mg | SUBCUTANEOUS | Status: DC
  Administered 2019-04-16 (×2): 40 mg via SUBCUTANEOUS
  Filled 2019-04-15 (×2): qty 0.4

## 2019-04-15 NOTE — ED Notes (Signed)
Patient O2 94%.  Briefly placed on non-rebreathe mask due to O2 dropping to 85% on 4L [place].

## 2019-04-15 NOTE — ED Notes (Signed)
Ernie Kasler Main, Charge RN informed this RN that the floor cannot take this pt at this time due to lack of tele boxes. Charge RN Lorriane Shire aware.

## 2019-04-15 NOTE — H&P (Signed)
History and Physical    Arthur Orozco YTK:[id number] DOB: 10-22-1935 DOA: 04/15/2019  Referring MD/NP/PA:   PCP: Rayetta Humphrey, MD   Patient coming from:  The patient is coming from home.  At baseline, pt is independent for most of ADL.        Chief Complaint: SOB  HPI: Arthur Orozco is a 83 y.o. male with medical history significant of GERD, pneumonia, arthritis, who presents with shortness of breath.  Patient reports his wife tested positive for novel coronavirus approximately 1 week ago 04/03/19. He started feeling increasing shortness of breath with cough over the last 4 days.  He has generalized weakness and myalgia.  No fever or chills.  No nausea or vomiting. Has mild diarrhea, no AP.  Patient has oxygen desaturation to 88% on room air, improved to 94% on 4 L nasal cannula oxygen.  ED Course: pt was found to have positive Covid 19 Ag, WBC 8.2, lactic acid 1.4, renal function okay, temperature normal, blood pressure 122/59, heart rate is 70s, tachypnea.  Chest x-ray showed bilateral scattered patchy infiltration.  Patient is admitted to MedSurg bed as inpatient.  Review of Systems:   General: no fevers, chills, no body weight gain, has poor appetite, has fatigue HEENT: no blurry vision, hearing changes or sore throat Respiratory: has dyspnea, coughing, no wheezing CV: no chest pain, no palpitations GI: no nausea, vomiting, abdominal pain, has diarrhea, no constipation GU: no dysuria, burning on urination, increased urinary frequency, hematuria  Ext: no leg edema Neuro: no unilateral weakness, numbness, or tingling, no vision change or hearing loss Skin: no rash, no skin tear. MSK: No muscle spasm, no deformity, no limitation of range of movement in spin Heme: No easy bruising.  Travel history: No recent long distant travel.  Allergy:  Allergies  Allergen Reactions  . Doxycycline Swelling    Unsure if from doxycyline or lymphadenopathy per ER. Ideally we would  avoid this ABX if possible.     Past Medical History:  Diagnosis Date  . Arthritis    cerv., lumbar spondylosis  . GERD (gastroesophageal reflux disease)   . Pneumonia    HOSP. - [facility]- 2001    Past Surgical History:  Procedure Laterality Date  . BACK SURGERY     lumbar- 1970's  . SHOULDER OPEN ROTATOR CUFF REPAIR     right shoulder, following fall from truck    Social History:  reports that he quit smoking about 40 years ago. He has never used smokeless tobacco. He reports current alcohol use. He reports that he does not use drugs.  Family History:  Family History  Problem Relation Age of Onset  . Anesthesia problems Neg Hx   . Hypotension Neg Hx   . Malignant hyperthermia Neg Hx   . Pseudochol deficiency Neg Hx      Prior to Admission medications   Medication Sig Start Date End Date Taking? Authorizing Provider  vitamin B-12 (CYANOCOBALAMIN) 1000 MCG tablet Take 1,000 mcg by mouth daily.    [provider]  albuterol (PROVENTIL HFA;VENTOLIN HFA) 108 (90 Base) MCG/ACT inhaler Inhale 2 puffs into the lungs every 4 (four) hours as needed for wheezing or shortness of breath. 04/28/16 04/15/19  Lucia Estelle, NP    Physical Exam: Vitals:   04/15/19 1430 04/15/19 1500 04/15/19 1530 04/15/19 1600  BP: 115/69 115/63 117/61 106/70  Pulse: 63 62 61 69  Resp: (!) 34 (!) 29 (!) 32 (!) 27  Temp:  TempSrc:      SpO2: 96% 96% 93% 92%  Weight:      Height:       General: Not in acute distress HEENT:       Eyes: PERRL, EOMI, no scleral icterus.       ENT: No discharge from the ears and nose, no pharynx injection, no tonsillar enlargement.        Neck: No JVD, no bruit, no mass felt. Heme: No neck lymph node enlargement. Cardiac: S1/S2, RRR, No murmurs, No gallops or rubs. Respiratory: No rales, wheezing, rhonchi or rubs. GI: Soft, nondistended, nontender, no rebound pain, no organomegaly, BS present. GU: No hematuria Ext: No pitting leg edema bilaterally. 2+DP/PT  pulse bilaterally. Musculoskeletal: No joint deformities, No joint redness or warmth, no limitation of ROM in spin. Skin: No rashes.  Neuro: Alert, oriented X3, cranial nerves II-XII grossly intact, moves all extremities normally.  Psych: Patient is not psychotic, no suicidal or hemocidal ideation.  Labs on Admission: I have personally reviewed following labs and imaging studies  CBC: Recent Labs  Lab 04/15/19 1202  WBC 8.2  NEUTROABS 7.4  HGB 12.4*  HCT 35.2*  MCV 87.6  PLT 239   Basic Metabolic Panel: Recent Labs  Lab 04/15/19 1202  NA 130*  K 3.8  CL 96*  CO2 22  GLUCOSE 127*  BUN 20  CREATININE 1.09  CALCIUM 7.8*   GFR: Estimated Creatinine Clearance: 46.2 mL/min (by C-G formula based on SCr of 1.09 mg/dL). Liver Function Tests: Recent Labs  Lab 04/15/19 1202  AST 43*  ALT 22  ALKPHOS 53  BILITOT 1.5*  PROT 7.4  ALBUMIN 3.4*   No results for input(s): LIPASE, AMYLASE in the last 168 hours. No results for input(s): AMMONIA in the last 168 hours. Coagulation Profile: No results for input(s): INR, PROTIME in the last 168 hours. Cardiac Enzymes: No results for input(s): CKTOTAL, CKMB, CKMBINDEX, TROPONINI in the last 168 hours. BNP (last 3 results) No results for input(s): PROBNP in the last 8760 hours. HbA1C: No results for input(s): HGBA1C in the last 72 hours. CBG: No results for input(s): GLUCAP in the last 168 hours. Lipid Profile: Recent Labs    04/15/19 1202  TRIG 77   Thyroid Function Tests: No results for input(s): TSH, T4TOTAL, FREET4, T3FREE, THYROIDAB in the last 72 hours. Anemia Panel: Recent Labs    04/15/19 1202  FERRITIN 650*   Urine analysis: No results found for: COLORURINE, APPEARANCEUR, LABSPEC, PHURINE, GLUCOSEU, HGBUR, BILIRUBINUR, KETONESUR, PROTEINUR, UROBILINOGEN, NITRITE, LEUKOCYTESUR Sepsis Labs: @LABRCNTIP (procalcitonin:4,lacticidven:4) )No results found for this or any previous visit (from the past 240 hour(s)).     Radiological Exams on Admission: DG Chest Port 1 View  Result Date: 04/15/2019 CLINICAL DATA:  Cough, COVID-19 exposure EXAM: PORTABLE CHEST 1 VIEW COMPARISON:  04/28/2016 FINDINGS: The heart size and mediastinal contours are within normal limits. Scattered patchy airspace opacities throughout both lungs. No pleural effusion or pneumothorax. Degenerative changes of the shoulders. IMPRESSION: Scattered patchy airspace opacities throughout both lungs, suspicious for multifocal atypical/viral pneumonia. Electronically Signed   By: Duanne GuessNicholas  Plundo M.D.   On: 04/15/2019 12:00     EKG: Independently reviewed.  Sinus rhythm, QTC 453, early R wave progression, no ischemic change.  Assessment/Plan Principal Problem:   Acute respiratory failure with hypoxia (HCC) Active Problems:   GERD (gastroesophageal reflux disease)   Acute respiratory disease due to COVID-19 virus: Oxygen desaturations to 88% on room air, chest x-ray showed bilateral scattered infiltration. -will  admit to med-surg bed as inpt -Remdesivir per pharm -Solumedrol 40 mg bid -vitamin C, zinc.   -Atrovent inhaler, PRN albuterol inhaler -PRN Mucinex for cough -f/u Blood culture -D-dimer, BNP,Trop, LFT, CRP, LDH, Procalcitonin, ferritin, fibinogen, TG, Hep B SAg -Daily CRP, Ferritin, D-dimer, -Will ask the patient to maintain an awake prone position for 16+ hours a day, if possible, with a minimum of 2-3 hours at a time -Will attempt to maintain euvolemia to a net negative fluid status -IF patient deteriorates, will consult PCCM and ID  GERD: -prn protonix  Inpatient status:  # Patient requires inpatient status due to high intensity of service, high risk for further deterioration and high frequency of surveillance required.  I certify that at the point of admission it is my clinical judgment that the patient will require inpatient hospital care spanning beyond 2 midnights from the point of admission.  . Now patient has  presenting with Acute respiratory disease due to COVID-19 virus with hypoxia . The initial radiographic and laboratory data are worrisome because of bilateral scattered infiltrates on chest x-ray. . Current medical needs: please see my assessment and plan . Predictability of an adverse outcome (risk): Patient has old age, now presents with acute respiratory disease due to COVID-19 virus with hypoxia. His presentation is highly complicated, at high risk of deteriorating given old age. Patient will need to be treated in hospital for at least 2 days      DVT ppx: SQ Lovenox Code Status: Full code Family Communication: None at bed side.  Disposition Plan:  Anticipate discharge back to previous home environment Consults called: none Admission status: med-surg as inpt    Date of Service 04/15/2019    [facility]   If 7PM-7AM, please contact night-coverage [email or web address] Password [email or web address] 04/15/2019, 6:18 PM

## 2019-04-15 NOTE — ED Notes (Signed)
O2 dropped to 88%, patient placed on 2L [place].

## 2019-04-15 NOTE — ED Provider Notes (Signed)
[place] [facility] Emergency Department Provider Note   ____________________________________________    I have reviewed the triage vital signs and the nursing notes.   HISTORY  Chief Complaint Shortness of Breath     HPI Arthur Orozco is a 83 y.o. male who presents with complaints of shortness of breath.  Patient reports his wife tested positive for novel coronavirus approximately 1 week ago, he is started feeling increasing shortness of breath with cough over the last 4 days.  Does not think that he has had fevers, does complain of some myalgias.  No nausea or vomiting.  Has been quarantining with his wife.  Past Medical History:  Diagnosis Date  . Arthritis    cerv., lumbar spondylosis  . GERD (gastroesophageal reflux disease)   . Pneumonia    HOSP. - [facility]- 2001    Patient Active Problem List   Diagnosis Date Noted  . Acute respiratory failure with hypoxia (HCC) 04/15/2019  . Suspected COVID-19 virus infection 04/15/2019    Past Surgical History:  Procedure Laterality Date  . BACK SURGERY     lumbar- 1970's  . SHOULDER OPEN ROTATOR CUFF REPAIR     right shoulder, following fall from truck    Prior to Admission medications   Medication Sig Start Date End Date Taking? Authorizing Provider  vitamin B-12 (CYANOCOBALAMIN) 1000 MCG tablet Take 1,000 mcg by mouth daily.    [provider]  albuterol (PROVENTIL HFA;VENTOLIN HFA) 108 (90 Base) MCG/ACT inhaler Inhale 2 puffs into the lungs every 4 (four) hours as needed for wheezing or shortness of breath. 04/28/16 04/15/19  Lucia EstelleZheng, Feng, NP     Allergies Doxycycline  Family History  Problem Relation Age of Onset  . Anesthesia problems Neg Hx   . Hypotension Neg Hx   . Malignant hyperthermia Neg Hx   . Pseudochol deficiency Neg Hx     Social History Social History   Tobacco Use  . Smoking status: Former Smoker    Quit date: 07/10/1978    Years since quitting: 40.7  .  Smokeless tobacco: Never Used  Substance Use Topics  . Alcohol use: Yes    Comment: holidays, socially   . Drug use: No    Review of Systems  Constitutional: As above Eyes: No visual changes.  ENT: No sore throat. Cardiovascular: Denies chest pain. Respiratory: As above Gastrointestinal: No abdominal pain.  No nausea, no vomiting.   Genitourinary: Negative for dysuria. Musculoskeletal: Negative for back pain. Skin: Negative for rash. Neurological: Negative for headaches    ____________________________________________   PHYSICAL EXAM:  VITAL SIGNS: ED Triage Vitals  Enc Vitals Group     BP 04/15/19 1117 134/67     Pulse Rate 04/15/19 1117 83     Resp 04/15/19 1117 (!) 24     Temp 04/15/19 1122 98.3 F (36.8 C)     Temp Source 04/15/19 1122 Oral     SpO2 04/15/19 1116 94 %     Weight 04/15/19 1117 73 kg (161 lb)     Height 04/15/19 1117 1.651 m (5\' 5" )     Head Circumference --      Peak Flow --      Pain Score 04/15/19 1117 0     Pain Loc --      Pain Edu? --      Excl. in GC? --     Constitutional: Alert and oriented.   Nose: No congestion/rhinnorhea. Mouth/Throat: Mucous membranes are moist.   Neck:  Painless ROM Cardiovascular: Normal rate, regular rhythm.  Good peripheral circulation. Respiratory: Increased respiratory effort with tachypnea t.  No retractions. Lungs CTAB. Gastrointestinal: Soft and nontender. No distention.  No CVA tenderness.  Musculoskeletal: No lower extremity tenderness nor edema.  Warm and well perfused Neurologic:  Normal speech and language. No gross focal neurologic deficits are appreciated.  Skin:  Skin is warm, dry and intact. No rash noted. Psychiatric: Mood and affect are normal. Speech and behavior are normal.  ____________________________________________   LABS (all labs ordered are listed, but only abnormal results are displayed)  Labs Reviewed  CBC WITH DIFFERENTIAL/PLATELET - Abnormal; Notable for the following  components:      Result Value   RBC 4.02 (*)    Hemoglobin 12.4 (*)    HCT 35.2 (*)    Lymphs Abs 0.4 (*)    All other components within normal limits  COMPREHENSIVE METABOLIC PANEL - Abnormal; Notable for the following components:   Sodium 130 (*)    Chloride 96 (*)    Glucose, Bld 127 (*)    Calcium 7.8 (*)    Albumin 3.4 (*)    AST 43 (*)    Total Bilirubin 1.5 (*)    All other components within normal limits  FIBRIN DERIVATIVES D-DIMER ([facility] ONLY) - Abnormal; Notable for the following components:   Fibrin derivatives D-dimer [facility]) 6,269.48 (*)    All other components within normal limits  LACTATE DEHYDROGENASE - Abnormal; Notable for the following components:   LDH 426 (*)    All other components within normal limits  FERRITIN - Abnormal; Notable for the following components:   Ferritin 650 (*)    All other components within normal limits  FIBRINOGEN - Abnormal; Notable for the following components:   Fibrinogen 692 (*)    All other components within normal limits  POC SARS CORONAVIRUS 2 AG - Abnormal; Notable for the following components:   SARS Coronavirus 2 Ag POSITIVE (*)    All other components within normal limits  CULTURE, BLOOD (ROUTINE X 2)  CULTURE, BLOOD (ROUTINE X 2)  LACTIC ACID, PLASMA  PROCALCITONIN  TRIGLYCERIDES  LACTIC ACID, PLASMA  C-REACTIVE PROTEIN  POC SARS CORONAVIRUS 2 AG -  ED   ____________________________________________  EKG  None ____________________________________________  RADIOLOGY  Chest x-ray patchy opacities throughout both lungs suspicious for Covid ____________________________________________   PROCEDURES  Procedure(s) performed: No  Procedures   Critical Care performed: yes  CRITICAL CARE Performed by: Lavonia Drafts   Total critical care time: 30 minutes  Critical care time was exclusive of separately billable procedures and treating other patients.  Critical care was necessary to treat or prevent  imminent or life-threatening deterioration.  Critical care was time spent personally by me on the following activities: development of treatment plan with patient and/or surrogate as well as nursing, discussions with consultants, evaluation of patient's response to treatment, examination of patient, obtaining history from patient or surrogate, ordering and performing treatments and interventions, ordering and review of laboratory studies, ordering and review of radiographic studies, pulse oximetry and re-evaluation of patient's condition.  ____________________________________________   INITIAL IMPRESSION / ASSESSMENT AND PLAN / ED COURSE  Pertinent labs & imaging results that were available during my care of the patient were reviewed by me and considered in my medical decision making (see chart for details).  Patient presents with shortness of breath with tachypnea, requiring oxygen to keep his saturations above 90%.  His wife has COVID-19, highly suspicious given his x-ray  that he also has COVID-19.  Pending nasal swab  Given O2 requirement will require admission, have paged hospitalist  POC Covid test is positive    ____________________________________________   FINAL CLINICAL IMPRESSION(S) / ED DIAGNOSES  Final diagnoses:  Acute respiratory failure with hypoxia (HCC)  COVID-19        Note:  This document was prepared using Dragon voice recognition software and may include unintentional dictation errors.   Jene Every, MD 04/15/19 1320

## 2019-04-15 NOTE — Progress Notes (Signed)
PHARMACIST - PHYSICIAN ORDER COMMUNICATION  CONCERNING: P&T Medication Policy on Herbal Medications  DESCRIPTION:  This patient's order for:  Arthur Orozco  has been noted.  This product(s) is classified as an "herbal" or natural product. Due to a lack of definitive safety studies or FDA approval, nonstandard manufacturing practices, plus the potential risk of unknown drug-drug interactions while on inpatient medications, the Pharmacy and Therapeutics Committee does not permit the use of "herbal" or natural products of this type within [facility].   ACTION TAKEN: The pharmacy department is unable to verify this order at this time and your patient has been informed of this safety policy. Please reevaluate patient's clinical condition at discharge and address if the herbal or natural product(s) should be resumed at that time.

## 2019-04-15 NOTE — ED Notes (Signed)
ED TO INPATIENT HANDOFF REPORT  ED Nurse Name and Phone #: Willene Hatchet Name/Age/Gender Arthur Orozco 83 y.o. male Room/Bed: ED01A/ED01A  Code Status   Code Status: Full Code  Home/SNF/Other Home Patient oriented to: self, place, time and situation Is this baseline? Yes   Triage Complete: Triage complete  Chief Complaint Suspected COVID-19 virus infection [Z20.828] COVID-19 virus infection [U07.1]  Triage Note Per EMS pt's wife tested positive for covid on 12/5- pt complaining of similar symptoms: SHOB that has been increasingly worse the past couple days- denies fever    Allergies Allergies  Allergen Reactions  . Doxycycline Swelling    Unsure if from doxycyline or lymphadenopathy per ER. Ideally we would avoid this ABX if possible.     Level of Care/Admitting Diagnosis ED Disposition    ED Disposition Condition [facility]: [place] [[id number]]  Level of Care: Med-Surg [16]  Covid Evaluation: Confirmed COVID Positive  Diagnosis: COVID-19 virus infection [[id number]]  Admitting Physician: Ivor Costa [[id number]]  Attending Physician: Ivor Costa [phone]  Estimated length of stay: past midnight tomorrow  Certification:: I certify this patient will need inpatient services for at least 2 midnights       B Medical/Surgery History Past Medical History:  Diagnosis Date  . Arthritis    cerv., lumbar spondylosis  . GERD (gastroesophageal reflux disease)   . Pneumonia    HOSP. - [facility]- 2001   Past Surgical History:  Procedure Laterality Date  . BACK SURGERY     lumbar- 1970's  . SHOULDER OPEN ROTATOR CUFF REPAIR     right shoulder, following fall from truck     A IV Location/Drains/Wounds Patient Lines/Drains/Airways Status   Active Line/Drains/Airways    Name:   Placement date:   Placement time:   Site:   Days:   Peripheral IV 04/15/19 Left Antecubital   04/15/19    1000    Antecubital   less than 1   Peripheral IV  04/15/19 Left Hand   04/15/19    1206    Hand   less than 1   Incision 07/18/11 Back Other (Comment)   07/18/11    0848     2828          Intake/Output Last 24 hours  Intake/Output Summary (Last 24 hours) at 04/15/2019 2155 Last data filed at 04/15/2019 1718 Gross per 24 hour  Intake 250 ml  Output --  Net 250 ml    Labs/Imaging Results for orders placed or performed during the hospital encounter of 04/15/19 (from the past 48 hour(s))  Lactic acid, plasma     Status: None   Collection Time: 04/15/19 12:02 PM  Result Value Ref Range   Lactic Acid, Venous 1.4 0.5 - 1.9 mmol/L    Comment: Performed at [facility], [address] [place], [place] [address]  CBC WITH DIFFERENTIAL     Status: Abnormal   Collection Time: 04/15/19 12:02 PM  Result Value Ref Range   WBC 8.2 4.0 - 10.5 K/uL   RBC 4.02 (L) 4.22 - 5.81 MIL/uL   Hemoglobin 12.4 (L) 13.0 - 17.0 g/dL   HCT 35.2 (L) 39.0 - 52.0 %   MCV 87.6 80.0 - 100.0 fL   MCH 30.8 26.0 - 34.0 pg   MCHC 35.2 30.0 - 36.0 g/dL   RDW 11.9 11.5 - 15.5 %   Platelets 239 150 - 400 K/uL   nRBC 0.0 0.0 - 0.2 %  Neutrophils Relative % 91 %   Neutro Abs 7.4 1.7 - 7.7 K/uL   Lymphocytes Relative 5 %   Lymphs Abs 0.4 (L) 0.7 - 4.0 K/uL   Monocytes Relative 4 %   Monocytes Absolute 0.4 0.1 - 1.0 K/uL   Eosinophils Relative 0 %   Eosinophils Absolute 0.0 0.0 - 0.5 K/uL   Basophils Relative 0 %   Basophils Absolute 0.0 0.0 - 0.1 K/uL   Immature Granulocytes 0 %   Abs Immature Granulocytes 0.03 0.00 - 0.07 K/uL    Comment: Performed at [facility], [address] [place], [place] [address]  Comprehensive metabolic panel     Status: Abnormal   Collection Time: 04/15/19 12:02 PM  Result Value Ref Range   Sodium 130 (L) 135 - 145 mmol/L   Potassium 3.8 3.5 - 5.1 mmol/L   Chloride 96 (L) 98 - 111 mmol/L   CO2 22 22 - 32 mmol/L   Glucose, Bld 127 (H) 70 - 99 mg/dL   BUN 20 8 - 23 mg/dL   Creatinine, Ser 4.69 0.61 -  1.24 mg/dL   Calcium 7.8 (L) 8.9 - 10.3 mg/dL   Total Protein 7.4 6.5 - 8.1 g/dL   Albumin 3.4 (L) 3.5 - 5.0 g/dL   AST 43 (H) 15 - 41 U/L   ALT 22 0 - 44 U/L   Alkaline Phosphatase 53 38 - 126 U/L   Total Bilirubin 1.5 (H) 0.3 - 1.2 mg/dL   GFR calc non Af Amer >60 >60 mL/min   GFR calc Af Amer >60 >60 mL/min   Anion gap 12 5 - 15    Comment: Performed at [facility], [address], [place], [place] [address]  Fibrin derivatives D-Dimer     Status: Abnormal   Collection Time: 04/15/19 12:02 PM  Result Value Ref Range   Fibrin derivatives D-dimer ([facility]) 3,166.97 (H) 0.00 - 499.00 ng/mL (FEU)    Comment: (NOTE) <> Exclusion of Venous Thromboembolism (VTE) - OUTPATIENT ONLY   (Emergency Department or [place])   0-499 ng/ml (FEU): With a low to intermediate pretest probability                      for VTE this test result excludes the diagnosis                      of VTE.   >499 ng/ml (FEU) : VTE not excluded; additional work up for VTE is                      required. <> Testing on Inpatients and Evaluation of Disseminated Intravascular   Coagulation (DIC) Reference Range:   0-499 ng/ml (FEU) Performed at [facility], [address] [place], [place] [address]   Procalcitonin     Status: None   Collection Time: 04/15/19 12:02 PM  Result Value Ref Range   Procalcitonin 0.23 ng/mL    Comment:        Interpretation: PCT (Procalcitonin) <= 0.5 ng/mL: Systemic infection (sepsis) is not likely. Local bacterial infection is possible. (NOTE)       Sepsis PCT Algorithm           Lower Respiratory Tract                                      Infection  PCT Algorithm    ----------------------------     ----------------------------         PCT < 0.25 ng/mL                PCT < 0.10 ng/mL         Strongly encourage             Strongly discourage   discontinuation of antibiotics    initiation of antibiotics    ----------------------------      -----------------------------       PCT 0.25 - 0.50 ng/mL            PCT 0.10 - 0.25 ng/mL               OR       >80% decrease in PCT            Discourage initiation of                                            antibiotics      Encourage discontinuation           of antibiotics    ----------------------------     -----------------------------         PCT >= 0.50 ng/mL              PCT 0.26 - 0.50 ng/mL               AND        <80% decrease in PCT             Encourage initiation of                                             antibiotics       Encourage continuation           of antibiotics    ----------------------------     -----------------------------        PCT >= 0.50 ng/mL                  PCT > 0.50 ng/mL               AND         increase in PCT                  Strongly encourage                                      initiation of antibiotics    Strongly encourage escalation           of antibiotics                                     -----------------------------                                           PCT <= 0.25 ng/mL  OR                                        > 80% decrease in PCT                                     Discontinue / Do not initiate                                             antibiotics Performed at [facility], [address] [place], [place] [address]   Lactate dehydrogenase     Status: Abnormal   Collection Time: 04/15/19 12:02 PM  Result Value Ref Range   LDH 426 (H) 98 - 192 U/L    Comment: Performed at [facility], [address] [place], [place] [address]  Ferritin     Status: Abnormal   Collection Time: 04/15/19 12:02 PM  Result Value Ref Range   Ferritin 650 (H) 24 - 336 ng/mL    Comment: Performed at [facility], [address] [place], [place] [address]  Triglycerides     Status: None   Collection Time: 04/15/19 12:02 PM  Result Value Ref Range    Triglycerides 77 <150 mg/dL    Comment: Performed at [facility], [address] [place], [place] [address]  Fibrinogen     Status: Abnormal   Collection Time: 04/15/19 12:02 PM  Result Value Ref Range   Fibrinogen 692 (H) 210 - 475 mg/dL    Comment: Performed at [facility], [address] [place], [place] [address]  C-reactive protein     Status: Abnormal   Collection Time: 04/15/19 12:02 PM  Result Value Ref Range   CRP 19.3 (H) <1.0 mg/dL    Comment: Performed at [facility], [address], [place], [place] [address]  ABO/Rh     Status: None   Collection Time: 04/15/19 12:02 PM  Result Value Ref Range   ABO/RH(D)      O POS Performed at [facility], [address] [place], [place] [address]   Brain natriuretic peptide     Status: Abnormal   Collection Time: 04/15/19 12:02 PM  Result Value Ref Range   B Natriuretic Peptide 139.0 (H) 0.0 - 100.0 pg/mL    Comment: Performed at [facility], [address] [place], [place] [address]  Hepatitis B surface antigen     Status: None   Collection Time: 04/15/19 12:02 PM  Result Value Ref Range   Hepatitis B Surface Ag NON REACTIVE NON REACTIVE    Comment: Performed at [facility], [address], [place], [place] [address]  POC SARS Coronavirus 2 Ag     Status: Abnormal   Collection Time: 04/15/19  1:19 PM  Result Value Ref Range   SARS Coronavirus 2 Ag POSITIVE (A) NEGATIVE    Comment: (NOTE) SARS-CoV-2 antigen PRESENT. Positive results indicate the presence of viral antigens, but clinical correlation with patient history and other diagnostic information is necessary to determine patient infection status.  Positive results do not rule out bacterial infection or co-infection  with other viruses. False positive results are rare but can occur,  and confirmatory RT-PCR testing may be appropriate in some circumstances. The expected result is Negative. Fact Sheet for  Patients: https://sanders-williams.net/https://www.fda.gov/media/139754/download Fact Sheet for Providers: https://martinez.com/https://www.fda.gov/media/139753/download  This test is not yet approved or cleared by the [place] FDA and  has been authorized for detection and/or diagnosis of SARS-CoV-2 by FDA under an Emergency Use Authorization (EUA).  This EUA will remain in effect (meaning this test can be used) for the duration of  the COVID-19 declaration under Section 564(b)(1) of the Act, 21 U.S.C. section 360bbb-3(b)(1), unless the a uthorization is terminated or revoked sooner.   Type and screen [facility]     Status: None   Collection Time: 04/15/19  1:23 PM  Result Value Ref Range   ABO/RH(D) O POS    Antibody Screen NEG    Sample Expiration      04/18/2019,2359 Performed at [facility], [address] [place], [place] [address]   Lactic acid, plasma     Status: None   Collection Time: 04/15/19  1:48 PM  Result Value Ref Range   Lactic Acid, Venous 1.2 0.5 - 1.9 mmol/L    Comment: Performed at [facility], [address] [place], [place] [address]  Influenza panel by PCR (type A & B)     Status: None   Collection Time: 04/15/19  4:13 PM  Result Value Ref Range   Influenza A By PCR NEGATIVE NEGATIVE   Influenza B By PCR NEGATIVE NEGATIVE    Comment: (NOTE) The Xpert Xpress Flu assay is intended as an aid in the diagnosis of  influenza and should not be used as a sole basis for treatment.  This  assay is FDA approved for nasopharyngeal swab specimens only. Nasal  washings and aspirates are unacceptable for Xpert Xpress Flu testing. Performed at [facility], [address] [place], [place] [address]    DG Chest [place]  Result Date: 04/15/2019 CLINICAL DATA:  Cough, COVID-19 exposure EXAM: PORTABLE CHEST 1 VIEW COMPARISON:  04/28/2016 FINDINGS: The heart size and mediastinal contours are within normal limits. Scattered patchy airspace opacities  throughout both lungs. No pleural effusion or pneumothorax. Degenerative changes of the shoulders. IMPRESSION: Scattered patchy airspace opacities throughout both lungs, suspicious for multifocal atypical/viral pneumonia. Electronically Signed   By: Duanne GuessNicholas  Plundo M.D.   On: 04/15/2019 12:00    Pending Labs Unresulted Labs (From admission, onward)    Start     Ordered   04/16/19 0500  CBC with Differential/Platelet  Daily,   STAT     04/15/19 1323   04/16/19 0500  Comprehensive metabolic panel  Daily,   STAT     04/15/19 1323   04/16/19 0500  C-reactive protein  Daily,   STAT     04/15/19 1323   04/16/19 0500  Fibrin derivatives D-Dimer ([facility] only)  Daily,   STAT     04/15/19 1323   04/16/19 0500  Ferritin  Daily,   STAT     04/15/19 1323   04/16/19 0500  Magnesium  Daily,   STAT     04/15/19 1323   04/15/19 1119  Blood Culture (routine x 2)  BLOOD CULTURE X 2,   STAT     04/15/19 1119          Vitals/Pain Today's Vitals   04/15/19 1430 04/15/19 1500 04/15/19 1530 04/15/19 1600  BP: 115/69 115/63 117/61 106/70  Pulse: 63 62 61 69  Resp: (!) 34 (!) 29 (!)  32 (!) 27  Temp:      TempSrc:      SpO2: 96% 96% 93% 92%  Weight:      Height:      PainSc:        Isolation Precautions Airborne and Contact precautions  Medications Medications  ipratropium (ATROVENT HFA) inhaler 2 puff (2 puffs Inhalation Not Given 04/15/19 1647)  albuterol (VENTOLIN HFA) 108 (90 Base) MCG/ACT inhaler 2 puff (has no administration in time range)  zinc sulfate capsule 220 mg (has no administration in time range)  ascorbic acid (VITAMIN C) tablet 500 mg (has no administration in time range)  enoxaparin (LOVENOX) injection 40 mg (has no administration in time range)  acetaminophen (TYLENOL) tablet 650 mg (has no administration in time range)  ondansetron (ZOFRAN) injection 4 mg (has no administration in time range)  guaiFENesin-dextromethorphan (ROBITUSSIN DM) 100-10 MG/5ML syrup 5 mL (has no  administration in time range)  calcium carbonate (OS-CAL - dosed in mg of elemental calcium) tablet 500 mg of elemental calcium (has no administration in time range)  cholecalciferol (VITAMIN D3) tablet 1,000 Units (1,000 Units Oral Not Given 04/15/19 2123)  remdesivir 200 mg in sodium chloride 0.9% 250 mL IVPB (0 mg Intravenous Stopped 04/15/19 1718)    Followed by  remdesivir 100 mg in sodium chloride 0.9 % 100 mL IVPB (has no administration in time range)  methylPREDNISolone sodium succinate (SOLU-MEDROL) 40 mg/mL injection 40 mg (40 mg Intravenous Given 04/15/19 1713)  pantoprazole (PROTONIX) EC tablet 40 mg (has no administration in time range)    Mobility walks with device Low fall risk   Focused Assessments Pulmonary Assessment Handoff:  Lung sounds:   O2 Device: Nasal Cannula O2 Flow Rate (L/min): 4 L/min      R Recommendations: See Admitting Provider Note  Report given to:   Additional Notes:

## 2019-04-15 NOTE — ED Triage Notes (Signed)
Patient c/o being exposed to [place] on 11/29. Wife tested positive on 12/5. Patient was not tested at that time. He has had a cough for 2 weeks but the last 4 days he has had increasing SOB and his symptoms worsened.

## 2019-04-15 NOTE — ED Provider Notes (Addendum)
[facility]    CSN: [id number] Arrival date & time: 04/15/19  0093      History   Chief Complaint Chief Complaint  Patient presents with  . Shortness of Breath  . Cough  . COVID exposure   HPI  83 year old male presents with the above complaints.  Patient's wife has recently been diagnosed with COVID-19.  She tested positive after being exposed to her daughter-in-law.  Wife states that she and her husband have been quarantining.  She states that he has had an ongoing cough in the past 2 weeks.  He has been worsening for the past 4 days.  He now has worsening cough and is short of breath.  He is a former smoker.  No reported fever.  Patient states that he feels very poorly.  He denies pain at this time.  Patient is overall ill appearing.  No known exacerbating factors.  No relieving factors.   **On arrival, patient ill-appearing and satting 88% on room air.  Tachypnea noted.  PMH, Surgical Hx, Family Hx, Social History reviewed and updated as below.  Past Medical History:  Diagnosis Date  . Arthritis    cerv., lumbar spondylosis  . GERD (gastroesophageal reflux disease)   . Pneumonia    HOSP. - [facility]- 2001   Past Surgical History:  Procedure Laterality Date  . BACK SURGERY     lumbar- 1970's  . SHOULDER OPEN ROTATOR CUFF REPAIR     right shoulder, following fall from truck   Family History Family History  Problem Relation Age of Onset  . Anesthesia problems Neg Hx   . Hypotension Neg Hx   . Malignant hyperthermia Neg Hx   . Pseudochol deficiency Neg Hx     Social History Social History   Tobacco Use  . Smoking status: Former Smoker    Quit date: 07/10/1978    Years since quitting: 40.7  . Smokeless tobacco: Never Used  Substance Use Topics  . Alcohol use: Yes    Comment: holidays, socially   . Drug use: No     Allergies   Patient has no known allergies.   Review of Systems Review of Systems  Constitutional: Positive for fatigue.  Negative for fever.  Respiratory: Positive for cough and shortness of breath.   Psychiatric/Behavioral: The patient is nervous/anxious.   All other systems reviewed and are negative.  Physical Exam Triage Vital Signs ED Triage Vitals  Enc Vitals Group     BP 04/15/19 1006 104/77     Pulse Rate 04/15/19 1006 92     Resp 04/15/19 1006 (!) 28     Temp 04/15/19 1006 98.2 F (36.8 C)     Temp Source 04/15/19 1006 Oral     SpO2 --      Weight 04/15/19 1007 161 lb (73 kg)     Height 04/15/19 1007 5\' 9"  (1.753 m)     Head Circumference --      Peak Flow --      Pain Score 04/15/19 1007 0     Pain Loc --      Pain Edu? --      Excl. in GC? --    Updated Vital Signs BP 104/77 (BP Location: Right Arm)   Pulse 92   Temp 98.2 F (36.8 C) (Oral)   Resp (!) 28   Ht 5\' 9"  (1.753 m)   Wt 73 kg   BMI 23.78 kg/m   Visual Acuity Right Eye Distance:   Left  Eye Distance:   Bilateral Distance:    Right Eye Near:   Left Eye Near:    Bilateral Near:     Physical Exam Vitals and nursing note reviewed.  Constitutional:      Appearance: He is not diaphoretic.     Comments: Overall ill appearing.  Tachypnea noted.  HENT:     Head: Normocephalic and atraumatic.     Nose: Rhinorrhea present.  Eyes:     General: No scleral icterus.       Right eye: No discharge.        Left eye: No discharge.     Conjunctiva/sclera: Conjunctivae normal.  Cardiovascular:     Rate and Rhythm: Normal rate and regular rhythm.     Heart sounds: No murmur.  Pulmonary:     Breath sounds: No rhonchi.     Comments: Increased work of breathing noted.  Diffuse crackles. Abdominal:     General: Abdomen is flat. There is no distension.  Skin:    General: Skin is warm.     Findings: No rash.  Neurological:     General: No focal deficit present.     Mental Status: He is alert and oriented to person, place, and time.  Psychiatric:        Behavior: Behavior normal.     Comments: Patient became tearful when I  informed him that he is going to go to the hospital.      UC Treatments / Results  Labs (all labs ordered are listed, but only abnormal results are displayed) Labs Reviewed - No data to display  EKG   Radiology No results found.  Procedures Procedures (including critical care time)  Medications Ordered in UC Medications - No data to display  Initial Impression / Assessment and Plan / UC Course  I have reviewed the triage vital signs and the nursing notes.  Pertinent labs & imaging results that were available during my care of the patient were reviewed by me and considered in my medical decision making (see chart for details).    83 year old male presents with suspected COVID-19.  Patient arrived ill-appearing, tachypneic.  Hypoxic, 88% on room air.  Nasal cannula oxygen was given.  Patient subsequently got upset and desaturated requiring nonrebreather for period of time.  Placed back on nasal cannula.  I suspect the patient has COVID-19 and subsequent pneumonia.  Patient going via EMS to the hospital.  Patient is stable at this time.  Will likely need admission.  Final Clinical Impressions(s) / UC Diagnoses   Final diagnoses:  Suspected COVID-19 virus infection  Acute respiratory failure with hypoxia [facility])   Discharge Instructions   None    ED Prescriptions    None     PDMP not reviewed this encounter.    Coral Spikes, [place] 04/15/19 1053

## 2019-04-15 NOTE — ED Triage Notes (Signed)
Per EMS pt's wife tested positive for covid on 12/5- pt complaining of similar symptoms: SHOB that has been increasingly worse the past couple days- denies fever

## 2019-04-15 NOTE — ED Notes (Signed)
Pt oxygen increased to 6L due to pt oxygen being 88% on 4L. MD [facility] aware.

## 2019-04-15 NOTE — ED Notes (Signed)
EMS called for transport to hospital. 

## 2019-04-15 NOTE — ED Notes (Signed)
SST and flu swab sent to lab

## 2019-04-15 NOTE — ED Notes (Signed)
Patient placed back on nasal cannula at 4L. O2 level now 97% on 4L [place].

## 2019-04-15 NOTE — Consult Note (Signed)
Remdesivir - Pharmacy Brief Note   O:  ALT: 22 CXR: Scattered patchy airspace opacities throughout both lungs, suspicious for multifocal atypical/viral pneumonia. SpO2: 95% on 4 L [place] (initial)  COVID Ag POSITIVE   A/P:  Remdesivir 200 mg IVPB once followed by 100 mg IVPB daily x 4 days.   Kristeen Miss, PharmD Clinical Pharmacist  04/15/2019 2:18 PM

## 2019-04-16 DIAGNOSIS — J9601 Acute respiratory failure with hypoxia: Secondary | ICD-10-CM

## 2019-04-16 DIAGNOSIS — J1282 Pneumonia due to coronavirus disease 2019: Secondary | ICD-10-CM | POA: Diagnosis present

## 2019-04-16 LAB — CBC WITH DIFFERENTIAL/PLATELET
Abs Immature Granulocytes: 0.02 10*3/uL (ref 0.00–0.07)
Basophils Absolute: 0 10*3/uL (ref 0.0–0.1)
Basophils Relative: 0 %
Eosinophils Absolute: 0 10*3/uL (ref 0.0–0.5)
Eosinophils Relative: 0 %
HCT: 34.1 % — ABNORMAL LOW (ref 39.0–52.0)
Hemoglobin: 12.3 g/dL — ABNORMAL LOW (ref 13.0–17.0)
Immature Granulocytes: 0 %
Lymphocytes Relative: 7 %
Lymphs Abs: 0.3 10*3/uL — ABNORMAL LOW (ref 0.7–4.0)
MCH: 30.5 pg (ref 26.0–34.0)
MCHC: 36.1 g/dL — ABNORMAL HIGH (ref 30.0–36.0)
MCV: 84.6 fL (ref 80.0–100.0)
Monocytes Absolute: 0.1 10*3/uL (ref 0.1–1.0)
Monocytes Relative: 2 %
Neutro Abs: 4.1 10*3/uL (ref 1.7–7.7)
Neutrophils Relative %: 91 %
Platelets: 230 10*3/uL (ref 150–400)
RBC: 4.03 MIL/uL — ABNORMAL LOW (ref 4.22–5.81)
RDW: 11.9 % (ref 11.5–15.5)
WBC: 4.6 10*3/uL (ref 4.0–10.5)
nRBC: 0 % (ref 0.0–0.2)

## 2019-04-16 LAB — COMPREHENSIVE METABOLIC PANEL
ALT: 20 U/L (ref 0–44)
AST: 38 U/L (ref 15–41)
Albumin: 3 g/dL — ABNORMAL LOW (ref 3.5–5.0)
Alkaline Phosphatase: 51 U/L (ref 38–126)
Anion gap: 9 (ref 5–15)
BUN: 20 mg/dL (ref 8–23)
CO2: 24 mmol/L (ref 22–32)
Calcium: 7.6 mg/dL — ABNORMAL LOW (ref 8.9–10.3)
Chloride: 99 mmol/L (ref 98–111)
Creatinine, Ser: 1.03 mg/dL (ref 0.61–1.24)
GFR calc Af Amer: >60 mL/min (ref >60)
GFR calc non Af Amer: >60 mL/min (ref >60)
Glucose, Bld: 264 mg/dL — ABNORMAL HIGH (ref 70–99)
Potassium: 4 mmol/L (ref 3.5–5.1)
Sodium: 132 mmol/L — ABNORMAL LOW (ref 135–145)
Total Bilirubin: 0.9 mg/dL (ref 0.3–1.2)
Total Protein: 6.9 g/dL (ref 6.5–8.1)

## 2019-04-16 LAB — TROPONIN I (HIGH SENSITIVITY): Troponin I (High Sensitivity): 8 ng/L (ref <18)

## 2019-04-16 LAB — FERRITIN: Ferritin: 597 ng/mL — ABNORMAL HIGH (ref 24–336)

## 2019-04-16 LAB — FIBRIN DERIVATIVES D-DIMER (ARMC ONLY): Fibrin derivatives D-dimer (ARMC): 2421.05 ng/mL (FEU) — ABNORMAL HIGH (ref 0.00–499.00)

## 2019-04-16 LAB — C-REACTIVE PROTEIN: CRP: 19.6 mg/dL — ABNORMAL HIGH (ref <1.0)

## 2019-04-16 LAB — MAGNESIUM: Magnesium: 2 mg/dL (ref 1.7–2.4)

## 2019-04-16 MED ORDER — ALUM & MAG HYDROXIDE-SIMETH 200-200-20 MG/5ML PO SUSP
30.0000 mL | Freq: Four times a day (QID) | ORAL | Status: DC | PRN
  Administered 2019-04-16: 30 mL via ORAL
  Filled 2019-04-16: qty 30

## 2019-04-16 MED ORDER — IPRATROPIUM BROMIDE HFA 17 MCG/ACT IN AERS
2.0000 | INHALATION_SPRAY | Freq: Four times a day (QID) | RESPIRATORY_TRACT | Status: DC | PRN
  Filled 2019-04-16: qty 12.9

## 2019-04-16 MED ORDER — DEXAMETHASONE SODIUM PHOSPHATE 4 MG/ML IJ SOLN
6.0000 mg | Freq: Every day | INTRAMUSCULAR | Status: DC
  Administered 2019-04-16: 6 mg via INTRAVENOUS
  Filled 2019-04-16: qty 1.5

## 2019-04-16 MED ORDER — SODIUM CHLORIDE 0.9 % IV SOLN
INTRAVENOUS | Status: DC | PRN
  Administered 2019-04-16: 250 mL via INTRAVENOUS

## 2019-04-16 MED ORDER — ALBUTEROL SULFATE HFA 108 (90 BASE) MCG/ACT IN AERS
2.0000 | INHALATION_SPRAY | Freq: Four times a day (QID) | RESPIRATORY_TRACT | Status: DC | PRN
  Filled 2019-04-16: qty 6.7

## 2019-04-16 MED ORDER — TOCILIZUMAB 400 MG/20ML IV SOLN
600.0000 mg | Freq: Once | INTRAVENOUS | Status: AC
  Administered 2019-04-16: 600 mg via INTRAVENOUS
  Filled 2019-04-16: qty 10

## 2019-04-16 MED ORDER — ADULT MULTIVITAMIN W/MINERALS CH: 1.0000 | ORAL_TABLET | Freq: Every day | ORAL | Status: DC

## 2019-04-16 MED ORDER — ENSURE ENLIVE PO LIQD
237.0000 mL | Freq: Three times a day (TID) | ORAL | Status: DC
  Administered 2019-04-16 (×2): 237 mL via ORAL

## 2019-04-16 MED ORDER — DEXAMETHASONE SODIUM PHOSPHATE 4 MG/ML IJ SOLN
6.0000 mg | Freq: Two times a day (BID) | INTRAMUSCULAR | Status: DC
  Administered 2019-04-16: 6 mg via INTRAVENOUS
  Filled 2019-04-16 (×2): qty 1.5

## 2019-04-16 NOTE — Progress Notes (Signed)
PROGRESS NOTE    Desiree LucyJoseph Harold Mcknight  XBJ:[id number] DOB: 01/30/1936 DOA: 04/15/2019 PCP: Rayetta HumphreyGeorge, Sionne A, MD      Brief Narrative:  Mr. Annabell HowellsWrenn is a 83 y.o. M with no significant past medical history who presented with 2 weeks flulike symptoms, tested COVID-19 positive 12/5, now with increasing shortness of breath for last few days.  In the ER, O2 desaturation to 88% on room air, hemodynamically stable, chest x-ray with multifocal pneumonia.       Assessment & Plan:  Coronavirus pneumonitis with acute hypoxic respiratory failure In the setting of the ongoing 2020 COVID-19 pandemic.  -Continue remdesivir, day 2 of 5 -Continue steroids, dex BID -Discussed with Dr. Jerral RalphGhimire, will give Actemra now -VTE PPx with Lovenox -Continue Zinc and Vitamin C -Flutter valve, turn, cough, incentive spirometry q2hrs while awake -Nutrition consult  Regarding off-label use of Actemra: This patient has confirmed COVID-19 in the setting of the ongoing 2020 coronavirus pandemic.  He has hypoxia and is high-risk for intubation (due to age, CRP > 14 mg/dL), but expected to survive >48 hours and has good baseline functional status.  He is not on immunomodulators, anti-rejection medications, or cancer chemotherapy, has no history of TB or latent TB, and no history of diverticulitis or intestinal perforation.  Platelets are >50K, ANC is >500, and ALT/AST are below 5x ULN with no known hepatitis B infection. The investigational nature of this medication was discussed with the patient and they choose to proceed as the potential benefits are felt to outweigh risks at this time.  -Tocilizumab 8 mg/kg now -Monitor for infusion reaction -Daily LFTs    Hyponatremia Mild       MDM and disposition: The below labs and imaging reports were reviewed and summarized above.  Medication management as above.  The patient was admitted with COVID-19, he remains on supplemental O2.  Given his age, he is at high  risk for a poor outcome.     DVT prophylaxis: Lovenox Code Status: FULL Family Communication: Wife by phone      Antimicrobials:   Remdesivir 12/17 >>   Culture data:   12/17 blood culture NGTD       Subjective: Patient tired, but no chest pain, dyspnea, confusion.  Objective: Vitals:   04/15/19 2345 04/15/19 2346 04/16/19 0031 04/16/19 0646  BP:  110/86 126/63 118/65  Pulse: 66 66 68 (!) 57  Resp:  (!) 25  20  Temp:  98.3 F (36.8 C) (!) 96.5 F (35.8 C) 97.6 F (36.4 C)  TempSrc:  Oral  Oral  SpO2: 95% 95% 91% 93%  Weight:      Height:        Intake/Output Summary (Last 24 hours) at 04/16/2019 0731 Last data filed at 04/15/2019 1718 Gross per 24 hour  Intake 250 ml  Output --  Net 250 ml   Filed Weights   04/15/19 1117  Weight: 73 kg    Examination: General appearance: Elderly adult male, alert and in no acute distress.  Appears tired, lying in bed.  Eyes closed. HEENT: Anicteric, conjunctiva pink, lids and lashes normal. No nasal deformity, discharge, epistaxis.  Lips moist, oropharynx tacky dry, no oral lesions, hearing normal. Skin: Warm and dry.  Senile purpura, no jaundice.  No suspicious rashes or lesions. Cardiac: Tachycardic, regular, nl S1-S2, no murmurs appreciated.  Capillary refill is brisk.  JVP normal.  No LE edema.  Radial pulses 2+ and symmetric. Respiratory: Tachypneic, lung sounds diminished, no rales or  wheezes. Abdomen: Abdomen soft.  No TTP or guarding. No ascites, distension, hepatosplenomegaly.   MSK: No deformities or effusions. Neuro: Awake and alert.  EOMI, moves all extremities. Speech fluent.   Asthenic. Psych: Sensorium intact and responding to questions, attention diminished. Affect blunted.  Judgment and insight appear normal.      Data Reviewed: I have personally reviewed following labs and imaging studies:  CBC: Recent Labs  Lab 04/15/19 1202 04/16/19 0118  WBC 8.2 4.6  NEUTROABS 7.4 4.1  HGB 12.4*  12.3*  HCT 35.2* 34.1*  MCV 87.6 84.6  PLT 239 790   Basic Metabolic Panel: Recent Labs  Lab 04/15/19 1202 04/16/19 0118  NA 130* 132*  K 3.8 4.0  CL 96* 99  CO2 22 24  GLUCOSE 127* 264*  BUN 20 20  CREATININE 1.09 1.03  CALCIUM 7.8* 7.6*  MG  --  2.0   GFR: Estimated Creatinine Clearance: 48.9 mL/min (by C-G formula based on SCr of 1.03 mg/dL). Liver Function Tests: Recent Labs  Lab 04/15/19 1202 04/16/19 0118  AST 43* 38  ALT 22 20  ALKPHOS 53 51  BILITOT 1.5* 0.9  PROT 7.4 6.9  ALBUMIN 3.4* 3.0*   No results for input(s): LIPASE, AMYLASE in the last 168 hours. No results for input(s): AMMONIA in the last 168 hours. Coagulation Profile: No results for input(s): INR, PROTIME in the last 168 hours. Cardiac Enzymes: No results for input(s): CKTOTAL, CKMB, CKMBINDEX, TROPONINI in the last 168 hours. BNP (last 3 results) No results for input(s): PROBNP in the last 8760 hours. HbA1C: No results for input(s): HGBA1C in the last 72 hours. CBG: No results for input(s): GLUCAP in the last 168 hours. Lipid Profile: Recent Labs    04/15/19 1202  TRIG 77   Thyroid Function Tests: No results for input(s): TSH, T4TOTAL, FREET4, T3FREE, THYROIDAB in the last 72 hours. Anemia Panel: Recent Labs    04/15/19 1202 04/16/19 0118  FERRITIN 650* 597*   Urine analysis: No results found for: COLORURINE, APPEARANCEUR, LABSPEC, PHURINE, GLUCOSEU, HGBUR, BILIRUBINUR, KETONESUR, PROTEINUR, UROBILINOGEN, NITRITE, LEUKOCYTESUR Sepsis Labs: @LABRCNTIP (procalcitonin:4,lacticacidven:4)  )No results found for this or any previous visit (from the past 240 hour(s)).       Radiology Studies: DG Chest Port 1 View  Result Date: 04/15/2019 CLINICAL DATA:  Cough, COVID-19 exposure EXAM: PORTABLE CHEST 1 VIEW COMPARISON:  04/28/2016 FINDINGS: The heart size and mediastinal contours are within normal limits. Scattered patchy airspace opacities throughout both lungs. No pleural  effusion or pneumothorax. Degenerative changes of the shoulders. IMPRESSION: Scattered patchy airspace opacities throughout both lungs, suspicious for multifocal atypical/viral pneumonia. Electronically Signed   By: Davina Poke M.D.   On: 04/15/2019 12:00        Scheduled Meds: . vitamin C  500 mg Oral Daily  . calcium carbonate  1 tablet Oral Daily  . cholecalciferol  1,000 Units Oral Daily  . enoxaparin (LOVENOX) injection  40 mg Subcutaneous Q24H  . guaiFENesin-dextromethorphan  5 mL Oral BID  . ipratropium  2 puff Inhalation Q4H  . methylPREDNISolone (SOLU-MEDROL) injection  40 mg Intravenous Q12H  . zinc sulfate  220 mg Oral Daily   Continuous Infusions: . remdesivir 100 mg in NS 100 mL       LOS: 1 day    Time spent: 35 minutes      Edwin Dada, MD [facility] 04/16/2019, 7:31 AM     Please page through [place]:  [email or web address] Contact charge nurse for password If  7PM-7AM, please contact night-coverage

## 2019-04-16 NOTE — Consult Note (Signed)
Remdesivir - Pharmacy Brief Note   O:  ALT: 22-->20 D-Dimer: 3167-->2421 CRP: 19.3-->19.6 CXR: Scattered patchy airspace opacities throughout both lungs, suspicious for multifocal atypical/viral pneumonia. SpO2: 93- 96% on 5 L [place]  COVID Ag POSITIVE   A/P:  Continue remdesivir 100 mg IVPB daily x 4 days total Completion Date: 12/22  Vallery Sa, PharmD Clinical Pharmacist  04/16/2019 10:45 AM

## 2019-04-16 NOTE — Progress Notes (Signed)
Initial Nutrition Assessment  DOCUMENTATION CODES:   Not applicable  INTERVENTION:   Ensure Enlive po TID, each supplement provides 350 kcal and 20 grams of protein  Magic cup TID with meals, each supplement provides 290 kcal and 9 grams of protein  MVI daily   NUTRITION DIAGNOSIS:   Increased nutrient needs related to acute illness(COVID 19) as evidenced by increased estimated needs.  GOAL:   Patient will meet greater than or equal to 90% of their needs  MONITOR:   PO intake, Supplement acceptance, Labs, Weight trends, Skin, I & O's  REASON FOR ASSESSMENT:   Consult Assessment of nutrition requirement/status  ASSESSMENT:   83 y.o. male with medical history significant of GERD, pneumonia and arthritis who is admitted with COVID 19  Unable to reach patient via phone. Per chart review, suspect pt with poor appetite and oral intake for at least several days pta. Pt has continued to have poor oral intake in hospital. RD will add supplements and vitamins to help pt meet his estimated needs. There is no weight history in chart to determine if any recent significant weight loss.   Medications reviewed and include: vitamin C, dexamethasone, zinc  Labs reviewed: Na 132(L), K 4.0 wnl, Mg 2.0 wnl  Unable to complete Nutrition-Focused physical exam at this time as patient with COVID 19.   Diet Order:   Diet Order            Diet regular Room service appropriate? Yes; Fluid consistency: Thin  Diet effective now             EDUCATION NEEDS:   Education needs have been addressed  Skin:  Skin Assessment: Reviewed RN Assessment  Last BM:  12/18  Height:   Ht Readings from Last 1 Encounters:  04/15/19 5\' 5"  (1.651 m)    Weight:   Wt Readings from Last 1 Encounters:  04/15/19 73 kg    Ideal Body Weight:  61.8 kg  BMI:  Body mass index is 26.79 kg/m.  Estimated Nutritional Needs:   Kcal:  1900-2200kcal/day  Protein:  95-110g/day  Fluid:   >1.6L/day  Koleen Distance MS, RD, LDN Pager #- [phone] Office#- [phone] After Hours Pager: [phone]

## 2019-04-16 NOTE — Discharge Summary (Signed)
Discussed transfer to [facility] with Dr. Sloan Leiter.  To tele bed, continue remdesivir, increase steroids, add Actemra.

## 2019-04-16 NOTE — ED Notes (Signed)
MAR will not allow this RN to scan medication. This RN administered ipratropium inhaler before transport. Pt refusing oral calcium medication at this time.

## 2019-04-17 ENCOUNTER — Other Ambulatory Visit: Payer: Self-pay

## 2019-04-17 ENCOUNTER — Inpatient Hospital Stay (HOSPITAL_COMMUNITY)
Admission: AD | Admit: 2019-04-17 | Discharge: 2019-04-20 | DRG: 177 | Disposition: A | Discharge status: Home / Self Care | Payer: Medicare Other | Source: Other Acute Inpatient Hospital | Attending: Internal Medicine | Admitting: Internal Medicine

## 2019-04-17 ENCOUNTER — Encounter (HOSPITAL_COMMUNITY): Payer: Self-pay | Admitting: Family Medicine

## 2019-04-17 DIAGNOSIS — R7401 Elevation of levels of liver transaminase levels: Secondary | ICD-10-CM | POA: Diagnosis present

## 2019-04-17 DIAGNOSIS — R739 Hyperglycemia, unspecified: Secondary | ICD-10-CM | POA: Diagnosis present

## 2019-04-17 DIAGNOSIS — Z87891 Personal history of nicotine dependence: Secondary | ICD-10-CM

## 2019-04-17 DIAGNOSIS — E871 Hypo-osmolality and hyponatremia: Secondary | ICD-10-CM | POA: Diagnosis present

## 2019-04-17 DIAGNOSIS — R001 Bradycardia, unspecified: Secondary | ICD-10-CM | POA: Diagnosis present

## 2019-04-17 DIAGNOSIS — J1289 Other viral pneumonia: Secondary | ICD-10-CM

## 2019-04-17 DIAGNOSIS — E869 Volume depletion, unspecified: Secondary | ICD-10-CM | POA: Diagnosis present

## 2019-04-17 DIAGNOSIS — Z881 Allergy status to other antibiotic agents status: Secondary | ICD-10-CM | POA: Diagnosis not present

## 2019-04-17 DIAGNOSIS — K219 Gastro-esophageal reflux disease without esophagitis: Secondary | ICD-10-CM | POA: Diagnosis present

## 2019-04-17 DIAGNOSIS — J9601 Acute respiratory failure with hypoxia: Secondary | ICD-10-CM | POA: Diagnosis present

## 2019-04-17 DIAGNOSIS — U071 COVID-19: Principal | ICD-10-CM

## 2019-04-17 DIAGNOSIS — Y92239 Unspecified place in hospital as the place of occurrence of the external cause: Secondary | ICD-10-CM | POA: Diagnosis present

## 2019-04-17 DIAGNOSIS — T50995A Adverse effect of other drugs, medicaments and biological substances, initial encounter: Secondary | ICD-10-CM | POA: Diagnosis present

## 2019-04-17 DIAGNOSIS — J1282 Pneumonia due to coronavirus disease 2019: Secondary | ICD-10-CM

## 2019-04-17 LAB — COMPREHENSIVE METABOLIC PANEL
ALT: 23 U/L (ref 0–44)
AST: 30 U/L (ref 15–41)
Albumin: 2.9 g/dL — ABNORMAL LOW (ref 3.5–5.0)
Alkaline Phosphatase: 56 U/L (ref 38–126)
Anion gap: 10 (ref 5–15)
BUN: 32 mg/dL — ABNORMAL HIGH (ref 8–23)
CO2: 27 mmol/L (ref 22–32)
Calcium: 8.8 mg/dL — ABNORMAL LOW (ref 8.9–10.3)
Chloride: 99 mmol/L (ref 98–111)
Creatinine, Ser: 1.04 mg/dL (ref 0.61–1.24)
GFR calc Af Amer: >60 mL/min (ref >60)
GFR calc non Af Amer: >60 mL/min (ref >60)
Glucose, Bld: 170 mg/dL — ABNORMAL HIGH (ref 70–99)
Potassium: 4.4 mmol/L (ref 3.5–5.1)
Sodium: 136 mmol/L (ref 135–145)
Total Bilirubin: 0.6 mg/dL (ref 0.3–1.2)
Total Protein: 7 g/dL (ref 6.5–8.1)

## 2019-04-17 LAB — D-DIMER, QUANTITATIVE: D-Dimer, Quant: 1.2 ug/mL-FEU — ABNORMAL HIGH (ref 0.00–0.50)

## 2019-04-17 LAB — CBC WITH DIFFERENTIAL/PLATELET
Abs Immature Granulocytes: 0.05 10*3/uL (ref 0.00–0.07)
Basophils Absolute: 0 10*3/uL (ref 0.0–0.1)
Basophils Relative: 0 %
Eosinophils Absolute: 0 10*3/uL (ref 0.0–0.5)
Eosinophils Relative: 0 %
HCT: 38.4 % — ABNORMAL LOW (ref 39.0–52.0)
Hemoglobin: 13 g/dL (ref 13.0–17.0)
Immature Granulocytes: 0 %
Lymphocytes Relative: 6 %
Lymphs Abs: 0.7 10*3/uL (ref 0.7–4.0)
MCH: 30.7 pg (ref 26.0–34.0)
MCHC: 33.9 g/dL (ref 30.0–36.0)
MCV: 90.6 fL (ref 80.0–100.0)
Monocytes Absolute: 0.4 10*3/uL (ref 0.1–1.0)
Monocytes Relative: 3 %
Neutro Abs: 10.2 10*3/uL — ABNORMAL HIGH (ref 1.7–7.7)
Neutrophils Relative %: 91 %
Platelets: 287 10*3/uL (ref 150–400)
RBC: 4.24 MIL/uL (ref 4.22–5.81)
RDW: 11.9 % (ref 11.5–15.5)
WBC: 11.3 10*3/uL — ABNORMAL HIGH (ref 4.0–10.5)
nRBC: 0 % (ref 0.0–0.2)

## 2019-04-17 LAB — C-REACTIVE PROTEIN: CRP: 11.2 mg/dL — ABNORMAL HIGH (ref <1.0)

## 2019-04-17 LAB — PHOSPHORUS: Phosphorus: 3.4 mg/dL (ref 2.5–4.6)

## 2019-04-17 LAB — ABO/RH: ABO/RH(D): O POS

## 2019-04-17 LAB — PROCALCITONIN: Procalcitonin: 0.2 ng/mL

## 2019-04-17 LAB — MAGNESIUM: Magnesium: 2.2 mg/dL (ref 1.7–2.4)

## 2019-04-17 MED ORDER — ONDANSETRON HCL 4 MG PO TABS: 4.0000 mg | ORAL_TABLET | Freq: Four times a day (QID) | ORAL | Status: DC | PRN

## 2019-04-17 MED ORDER — SENNOSIDES-DOCUSATE SODIUM 8.6-50 MG PO TABS: 1.0000 | ORAL_TABLET | Freq: Every evening | ORAL | Status: DC | PRN

## 2019-04-17 MED ORDER — ONDANSETRON HCL 4 MG/2ML IJ SOLN: 4.0000 mg | Freq: Four times a day (QID) | INTRAMUSCULAR | Status: DC | PRN

## 2019-04-17 MED ORDER — ENOXAPARIN SODIUM 40 MG/0.4ML ~~LOC~~ SOLN
40.0000 mg | SUBCUTANEOUS | Status: DC
  Administered 2019-04-17 – 2019-04-18 (×2): 40 mg via SUBCUTANEOUS
  Filled 2019-04-17 (×3): qty 0.4

## 2019-04-17 MED ORDER — SODIUM CHLORIDE 0.9% FLUSH
3.0000 mL | Freq: Two times a day (BID) | INTRAVENOUS | Status: DC
  Administered 2019-04-17 – 2019-04-18 (×4): 3 mL via INTRAVENOUS

## 2019-04-17 MED ORDER — GUAIFENESIN-DM 100-10 MG/5ML PO SYRP: 10.0000 mL | ORAL_SOLUTION | ORAL | Status: DC | PRN

## 2019-04-17 MED ORDER — SODIUM CHLORIDE 0.9 % IV SOLN
100.0000 mg | Freq: Every day | INTRAVENOUS | Status: AC
  Administered 2019-04-17 – 2019-04-19 (×3): 100 mg via INTRAVENOUS
  Filled 2019-04-17 (×3): qty 20

## 2019-04-17 MED ORDER — PANTOPRAZOLE SODIUM 40 MG PO TBEC
40.0000 mg | DELAYED_RELEASE_TABLET | Freq: Every day | ORAL | Status: DC
  Administered 2019-04-17 – 2019-04-20 (×4): 40 mg via ORAL
  Filled 2019-04-17 (×4): qty 1

## 2019-04-17 MED ORDER — DEXAMETHASONE SODIUM PHOSPHATE 10 MG/ML IJ SOLN
6.0000 mg | Freq: Two times a day (BID) | INTRAMUSCULAR | Status: AC
  Administered 2019-04-17 – 2019-04-19 (×6): 6 mg via INTRAVENOUS
  Filled 2019-04-17 (×6): qty 1

## 2019-04-17 MED ORDER — SODIUM CHLORIDE 0.9 % IV SOLN: 250.0000 mL | INTRAVENOUS | Status: DC | PRN

## 2019-04-17 MED ORDER — HYDROCODONE-ACETAMINOPHEN 5-325 MG PO TABS: 1.0000 | ORAL_TABLET | Freq: Four times a day (QID) | ORAL | Status: DC | PRN

## 2019-04-17 MED ORDER — SODIUM CHLORIDE 0.9% FLUSH: 3.0000 mL | INTRAVENOUS | Status: DC | PRN

## 2019-04-17 MED ORDER — ACETAMINOPHEN 325 MG PO TABS: 650.0000 mg | ORAL_TABLET | Freq: Four times a day (QID) | ORAL | Status: DC | PRN

## 2019-04-17 NOTE — Progress Notes (Signed)
Pt discharged to [facility], wife updated on patients status

## 2019-04-17 NOTE — Progress Notes (Signed)
Arthur Orozco  DQQ:[id number] DOB: [birth year] DOA: 04/17/2019 PCP: Rayetta Humphrey, MD    Brief Narrative:  83 year old with a history of GERD and arthritis who presented to the [facility] ED 12/17 with a 2-week history of cough and 4 days of progressively worsening shortness of breath.  His wife was known to be Covid positive and had experienced approximately 2 weeks of flulike illness.  In the ED the patient himself was afebrile but was found to be saturating in the mid 80s on room air and tachypneic.  CXR noted multifocal atypical pneumonia and Covid testing was positive.  Sodium was also noted to be 130.  The patient was treated with IV steroids, remdesivir, and Actemra and transferred to [facility] 12/19.  Significant Events: 12/17 admit to [facility] via ED 12/19 transfer to [facility]  COVID-19 specific Treatment: Decadron 12/17 > Remdesivir 12/17 > Actemra 12/18  Subjective: Follow-up visit  Assessment & Plan:  COVID Pneumonia -acute hypoxic respiratory failure  Recent Labs  Lab 04/15/19 1202 04/16/19 0118 04/17/19 0631  DDIMER  --   --  1.20*  FERRITIN 650* 597*  --   CRP 19.3* 19.6* 11.2*  ALT 22 20 23   PROCALCITON 0.23  --  0.20    Hyponatremia  Sinus bradycardia  DVT prophylaxis: Lovenox Code Status: FULL CODE Family Communication:  Disposition Plan:   Consultants:  none  Antimicrobials:  None  Objective: Blood pressure 120/62, pulse (!) 49, temperature (!) 97.1 F (36.2 C), temperature source Oral, resp. rate 19, height 5\' 5"  (1.651 m), weight 73 kg, SpO2 93 %.  Intake/Output Summary (Last 24 hours) at 04/17/2019 0955 Last data filed at 04/17/2019 0314 Gross per 24 hour  Intake 10 ml  Output --  Net 10 ml   Filed Weights   04/17/19 0230  Weight: 73 kg    Examination: Patient was seen in follow-up  CBC: Recent Labs  Lab 04/15/19 1202 04/16/19 0118 04/17/19 0631  WBC 8.2 4.6 11.3*  NEUTROABS 7.4 4.1 10.2*  HGB 12.4* 12.3*  13.0  HCT 35.2* 34.1* 38.4*  MCV 87.6 84.6 90.6  PLT 239 230 287   Basic Metabolic Panel: Recent Labs  Lab 04/15/19 1202 04/16/19 0118 04/17/19 0631  NA 130* 132* 136  K 3.8 4.0 4.4  CL 96* 99 99  CO2 22 24 27   GLUCOSE 127* 264* 170*  BUN 20 20 32*  CREATININE 1.09 1.03 1.04  CALCIUM 7.8* 7.6* 8.8*  MG  --  2.0 2.2  PHOS  --   --  3.4   GFR: Estimated Creatinine Clearance: 48.5 mL/min (by C-G formula based on SCr of 1.04 mg/dL).  Liver Function Tests: Recent Labs  Lab 04/15/19 1202 04/16/19 0118 04/17/19 0631  AST 43* 38 30  ALT 22 20 23   ALKPHOS 53 51 56  BILITOT 1.5* 0.9 0.6  PROT 7.4 6.9 7.0  ALBUMIN 3.4* 3.0* 2.9*    Recent Results (from the past 240 hour(s))  Blood Culture (routine x 2)     Status: None (Preliminary result)   Collection Time: 04/15/19 12:02 PM   Specimen: BLOOD  Result Value Ref Range Status   Specimen Description BLOOD LEFT ANTECUBITAL  Final   Special Requests   Final    BOTTLES DRAWN AEROBIC AND ANAEROBIC Blood Culture adequate volume   Culture   Final    NO GROWTH 2 DAYS Performed at [facility], [address], [place], 04/17/19 [facility]    Report Status  PENDING  Incomplete  Blood Culture (routine x 2)     Status: None (Preliminary result)   Collection Time: 04/15/19 12:06 PM   Specimen: BLOOD  Result Value Ref Range Status   Specimen Description BLOOD BLOOD LEFT HAND  Final   Special Requests   Final    BOTTLES DRAWN AEROBIC AND ANAEROBIC Blood Culture adequate volume   Culture   Final    NO GROWTH 2 DAYS Performed at [facility], [place], [place] [address]    Report Status PENDING  Incomplete     Scheduled Meds: . dexamethasone (DECADRON) injection  6 mg Intravenous Q12H  . enoxaparin (LOVENOX) injection  40 mg Subcutaneous Q24H  . pantoprazole  40 mg Oral Daily  . sodium chloride flush  3 mL Intravenous Q12H   Continuous Infusions: . sodium chloride    . remdesivir 100 mg in  NS 100 mL       LOS: 0 days   Cherene Altes, MD [facility] Office  [phone] Pager - Text Page per Amion  If 7PM-7AM, please contact night-coverage per Amion 04/17/2019, 9:55 AM

## 2019-04-17 NOTE — Progress Notes (Signed)
Pt wife updated and all questions answered.  

## 2019-04-17 NOTE — H&P (Signed)
History and Physical    Abdulhamid Olgin WJX:[id number] DOB: Jan 25, 1936 DOA: 04/17/2019  PCP: Rayetta Humphrey, MD   Patient coming from: Home   Chief Complaint: Cough, malaise, SOB   HPI: Cale Bethard is a 83 y.o. male with medical history significant for GERD and arthritis, presenting to the [facility] emergency department on 04/15/2019 with 2 weeks of cough and approximately 4 days of progressive shortness of breath.  Patient's wife had tested positive for the coronavirus and patient had been experiencing approximately 2 weeks of flulike illness with nonproductive cough before becoming dyspneic with minimal exertion which prompted his presentation to the ED.  He denied any chest pain, abdominal pain, vomiting, diarrhea, or leg swelling or tenderness.  [facility] ED and Hospital course: Upon arrival to the ED, patient is found to be afebrile, saturating in the mid 80s on room air, tachypneic, and with stable blood pressure.  Chest x-ray was suspicious for multifocal atypical pneumonia, COVID-19 testing was positive, CRP was elevated to 19.3, procalcitonin 0.23, and sodium was 130.  Patient was admitted to the hospitalist service, started on IV steroids and remdesivir on 04/15/2019, treated with Actemra on 04/16/2019, and transferred to [facility] campus early a.m. of 04/17/2019 for ongoing evaluation and management.  Review of Systems:  All other systems reviewed and apart from HPI, are negative.  Past Medical History:  Diagnosis Date  . Arthritis    cerv., lumbar spondylosis  . GERD (gastroesophageal reflux disease)   . Pneumonia    HOSP. - [facility]- 2001    Past Surgical History:  Procedure Laterality Date  . BACK SURGERY     lumbar- 1970's  . SHOULDER OPEN ROTATOR CUFF REPAIR     right shoulder, following fall from truck     reports that he quit smoking about 40 years ago. He has never used smokeless tobacco. He reports current alcohol use. He reports that he does not use  drugs.  Allergies  Allergen Reactions  . Doxycycline Swelling    Unsure if from doxycyline or lymphadenopathy per ER. Ideally we would avoid this ABX if possible.     Family History  Problem Relation Age of Onset  . Anesthesia problems Neg Hx   . Hypotension Neg Hx   . Malignant hyperthermia Neg Hx   . Pseudochol deficiency Neg Hx      Prior to Admission medications   Medication Sig Start Date End Date Taking? Authorizing Provider  albuterol (PROVENTIL HFA;VENTOLIN HFA) 108 (90 Base) MCG/ACT inhaler Inhale 2 puffs into the lungs every 4 (four) hours as needed for wheezing or shortness of breath. 04/28/16 04/15/19  Lucia Estelle, NP    Physical Exam: Vitals:   04/17/19 0147  BP: 126/64  Pulse: 65  Resp: 19  Temp: 97.9 F (36.6 C)  TempSrc: Oral  SpO2: 97%    Constitutional: NAD, calm  Eyes: PERTLA, lids and conjunctivae normal ENMT: Mucous membranes are moist. Posterior pharynx clear of any exudate or lesions.   Neck: normal, supple, no masses, no thyromegaly Respiratory: speaking full sentences, no wheezing. No accessory muscle use.  Cardiovascular: S1 & S2 heard, regular rate and rhythm. No extremity edema.   Abdomen: No distension, no tenderness, soft. Bowel sounds active.  Musculoskeletal: no clubbing / cyanosis. No joint deformity upper and lower extremities.    Skin: no significant rashes, lesions, ulcers. Warm, dry, well-perfused. Neurologic: No facial asymmetry. Sensation intact. Moving all extremities.  Psychiatric: Alert, answering basic questions appropriately. Pleasant, cooperative.  Labs on Admission: I have personally reviewed following labs and imaging studies  CBC: Recent Labs  Lab 04/15/19 1202 04/16/19 0118  WBC 8.2 4.6  NEUTROABS 7.4 4.1  HGB 12.4* 12.3*  HCT 35.2* 34.1*  MCV 87.6 84.6  PLT 239 230   Basic Metabolic Panel: Recent Labs  Lab 04/15/19 1202 04/16/19 0118  NA 130* 132*  K 3.8 4.0  CL 96* 99  CO2 22 24  GLUCOSE 127*  264*  BUN 20 20  CREATININE 1.09 1.03  CALCIUM 7.8* 7.6*  MG  --  2.0   GFR: Estimated Creatinine Clearance: 48.9 mL/min (by C-G formula based on SCr of 1.03 mg/dL). Liver Function Tests: Recent Labs  Lab 04/15/19 1202 04/16/19 0118  AST 43* 38  ALT 22 20  ALKPHOS 53 51  BILITOT 1.5* 0.9  PROT 7.4 6.9  ALBUMIN 3.4* 3.0*   No results for input(s): LIPASE, AMYLASE in the last 168 hours. No results for input(s): AMMONIA in the last 168 hours. Coagulation Profile: No results for input(s): INR, PROTIME in the last 168 hours. Cardiac Enzymes: No results for input(s): CKTOTAL, CKMB, CKMBINDEX, TROPONINI in the last 168 hours. BNP (last 3 results) No results for input(s): PROBNP in the last 8760 hours. HbA1C: No results for input(s): HGBA1C in the last 72 hours. CBG: No results for input(s): GLUCAP in the last 168 hours. Lipid Profile: Recent Labs    04/15/19 1202  TRIG 77   Thyroid Function Tests: No results for input(s): TSH, T4TOTAL, FREET4, T3FREE, THYROIDAB in the last 72 hours. Anemia Panel: Recent Labs    04/15/19 1202 04/16/19 0118  FERRITIN 650* 597*   Urine analysis: No results found for: COLORURINE, APPEARANCEUR, LABSPEC, PHURINE, GLUCOSEU, HGBUR, BILIRUBINUR, KETONESUR, PROTEINUR, UROBILINOGEN, NITRITE, LEUKOCYTESUR Sepsis Labs: @LABRCNTIP (procalcitonin:4,lacticidven:4) ) Recent Results (from the past 240 hour(s))  Blood Culture (routine x 2)     Status: None (Preliminary result)   Collection Time: 04/15/19 12:02 PM   Specimen: BLOOD  Result Value Ref Range Status   Specimen Description BLOOD LEFT ANTECUBITAL  Final   Special Requests   Final    BOTTLES DRAWN AEROBIC AND ANAEROBIC Blood Culture adequate volume   Culture   Final    NO GROWTH < 24 HOURS Performed at [facility], [address] [place], [place] [address]    Report Status PENDING  Incomplete  Blood Culture (routine x 2)     Status: None (Preliminary result)   Collection  Time: 04/15/19 12:06 PM   Specimen: BLOOD  Result Value Ref Range Status   Specimen Description BLOOD BLOOD LEFT HAND  Final   Special Requests   Final    BOTTLES DRAWN AEROBIC AND ANAEROBIC Blood Culture adequate volume   Culture   Final    NO GROWTH < 24 HOURS Performed at [facility], [address] [place], [place] [address]    Report Status PENDING  Incomplete     Radiological Exams on Admission: DG Chest Port 1 View  Result Date: 04/15/2019 CLINICAL DATA:  Cough, COVID-19 exposure EXAM: PORTABLE CHEST 1 VIEW COMPARISON:  04/28/2016 FINDINGS: The heart size and mediastinal contours are within normal limits. Scattered patchy airspace opacities throughout both lungs. No pleural effusion or pneumothorax. Degenerative changes of the shoulders. IMPRESSION: Scattered patchy airspace opacities throughout both lungs, suspicious for multifocal atypical/viral pneumonia. Electronically Signed   By: Duanne GuessNicholas  Plundo M.D.   On: 04/15/2019 12:00     Assessment/Plan   1. COVID-19 pneumonia; acute hypoxic  respiratory failure  - [facility] with 2 wks flu-like illness and 4 days progressive SOB, found to have COVID-19 with multifocal patchy opacities on CXR, new 5Lpm supplemental O2 requirement, procalcitonin 0.23, CRP 19.3, and fibrin derivatives 3170  - He was started on systemic steroid and remdesivir on 04/15/19 and treated with Actemra on 12/18  - Continue steroids, remdesivir, supplemental O2, supportive care, and trend inflammatory markers    DVT prophylaxis: Lovenox  Code Status: Full  Family Communication: Discussed with patient  Consults called: None  Admission status: Inpatient     Vianne Bulls, MD [facility] Pager [phone]  If 7PM-7AM, please contact night-coverage [email or web address] Password [email or web address]  04/17/2019, 3:04 AM

## 2019-04-18 LAB — PHOSPHORUS: Phosphorus: 3.5 mg/dL (ref 2.5–4.6)

## 2019-04-18 LAB — CBC WITH DIFFERENTIAL/PLATELET
Abs Immature Granulocytes: 0.15 10*3/uL — ABNORMAL HIGH (ref 0.00–0.07)
Basophils Absolute: 0 10*3/uL (ref 0.0–0.1)
Basophils Relative: 0 %
Eosinophils Absolute: 0 10*3/uL (ref 0.0–0.5)
Eosinophils Relative: 0 %
HCT: 38.5 % — ABNORMAL LOW (ref 39.0–52.0)
Hemoglobin: 13 g/dL (ref 13.0–17.0)
Immature Granulocytes: 2 %
Lymphocytes Relative: 4 %
Lymphs Abs: 0.4 10*3/uL — ABNORMAL LOW (ref 0.7–4.0)
MCH: 30.7 pg (ref 26.0–34.0)
MCHC: 33.8 g/dL (ref 30.0–36.0)
MCV: 90.8 fL (ref 80.0–100.0)
Monocytes Absolute: 0.2 10*3/uL (ref 0.1–1.0)
Monocytes Relative: 2 %
Neutro Abs: 8.6 10*3/uL — ABNORMAL HIGH (ref 1.7–7.7)
Neutrophils Relative %: 92 %
Platelets: 297 10*3/uL (ref 150–400)
RBC: 4.24 MIL/uL (ref 4.22–5.81)
RDW: 12.1 % (ref 11.5–15.5)
WBC: 9.3 10*3/uL (ref 4.0–10.5)
nRBC: 0 % (ref 0.0–0.2)

## 2019-04-18 LAB — COMPREHENSIVE METABOLIC PANEL
ALT: 29 U/L (ref 0–44)
AST: 31 U/L (ref 15–41)
Albumin: 3 g/dL — ABNORMAL LOW (ref 3.5–5.0)
Alkaline Phosphatase: 52 U/L (ref 38–126)
Anion gap: 10 (ref 5–15)
BUN: 32 mg/dL — ABNORMAL HIGH (ref 8–23)
CO2: 28 mmol/L (ref 22–32)
Calcium: 8.8 mg/dL — ABNORMAL LOW (ref 8.9–10.3)
Chloride: 100 mmol/L (ref 98–111)
Creatinine, Ser: 0.92 mg/dL (ref 0.61–1.24)
GFR calc Af Amer: >60 mL/min (ref >60)
GFR calc non Af Amer: >60 mL/min (ref >60)
Glucose, Bld: 200 mg/dL — ABNORMAL HIGH (ref 70–99)
Potassium: 4.8 mmol/L (ref 3.5–5.1)
Sodium: 138 mmol/L (ref 135–145)
Total Bilirubin: 0.8 mg/dL (ref 0.3–1.2)
Total Protein: 6.6 g/dL (ref 6.5–8.1)

## 2019-04-18 LAB — C-REACTIVE PROTEIN: CRP: 7.2 mg/dL — ABNORMAL HIGH (ref <1.0)

## 2019-04-18 LAB — D-DIMER, QUANTITATIVE: D-Dimer, Quant: 0.83 ug/mL-FEU — ABNORMAL HIGH (ref 0.00–0.50)

## 2019-04-18 LAB — MAGNESIUM: Magnesium: 2.2 mg/dL (ref 1.7–2.4)

## 2019-04-18 MED ORDER — ASCORBIC ACID 500 MG PO TABS
1000.0000 mg | ORAL_TABLET | Freq: Every day | ORAL | Status: DC
  Administered 2019-04-18 – 2019-04-20 (×3): 1000 mg via ORAL
  Filled 2019-04-18 (×3): qty 2

## 2019-04-18 MED ORDER — VITAMIN D3 25 MCG (1000 UNIT) PO TABS
2000.0000 [IU] | ORAL_TABLET | Freq: Every day | ORAL | Status: DC
  Administered 2019-04-18 – 2019-04-20 (×3): 2000 [IU] via ORAL
  Filled 2019-04-18 (×5): qty 2

## 2019-04-18 MED ORDER — FUROSEMIDE 10 MG/ML IJ SOLN
40.0000 mg | Freq: Once | INTRAMUSCULAR | Status: AC
  Administered 2019-04-18: 12:00:00 40 mg via INTRAVENOUS
  Filled 2019-04-18: qty 4

## 2019-04-18 MED ORDER — ZINC SULFATE 220 (50 ZN) MG PO CAPS
220.0000 mg | ORAL_CAPSULE | Freq: Every day | ORAL | Status: DC
  Administered 2019-04-18 – 2019-04-20 (×3): 220 mg via ORAL
  Filled 2019-04-18 (×3): qty 1

## 2019-04-18 NOTE — Progress Notes (Signed)
Patient remains AOx4.  He is on 7L HFNC saturating around 94% when resting.  He consumes his meals and complains of no pain.  Spoke with his wife about patients condition and how well he is doing in the hospital.  Patient wife asked for physician to call her and I transferred the request.  He had a large B/M today and was started on Lasix.  After therapy his UOP was 1610ml roughly. Patient is ambulatory and very aware of his surroundings and need for safety.  Remdesivir was delivered today.

## 2019-04-18 NOTE — Progress Notes (Signed)
Arthur Orozco  ZOX:[id number] DOB: 07-20-1935 DOA: 04/17/2019 PCP: Rayetta Humphrey, MD    Brief Narrative:  83 year old with a history of GERD and arthritis who presented to the [facility] ED 12/17 with a 2-week history of cough and 4 days of progressively worsening shortness of breath.  His wife was known to be Covid positive and had experienced approximately 2 weeks of flulike illness.  In the ED the patient himself was afebrile but was found to be saturating in the mid 80s on room air and tachypneic.  CXR noted multifocal atypical pneumonia and Covid testing was positive.  Sodium was also noted to be 130.  The patient was treated with IV steroids, remdesivir, and Actemra and transferred to [facility] campus 12/19.  Significant Events: 12/17 admit to [facility] via ED 12/19 transfer to [facility]  COVID-19 specific Treatment: Decadron 12/18 > Remdesivir 12/17 > 12/21 Actemra 12/18  Subjective: The patient is resting comfortably in bed.  He tells me he feels great.  He denies chest pain fevers chills nausea vomiting or abdominal pain.  He is however requiring 6 L of high flow nasal cannula to keep his saturations in the low 90s.  Assessment & Plan:  COVID Pneumonia -acute hypoxic respiratory failure Continues to require significant oxygen support -inflammatory markers trending favorably -in no significant distress clinically -patient needs to ambulate/set up -push pulmonary hygiene -wean oxygen as able  Recent Labs  Lab 04/15/19 1202 04/16/19 0118 04/17/19 0631 04/18/19 0115  DDIMER  --   --  1.20* 0.83*  FERRITIN 650* 597*  --   --   CRP 19.3* 19.6* 11.2* 7.2*  ALT 22 20 23 29   PROCALCITON 0.23  --  0.20  --     Hyponatremia Likely simply related to volume depletion -corrected with sodium now normal  Sinus bradycardia Transient with heart rate now consistently 65-90   DVT prophylaxis: Lovenox Code Status: FULL CODE Family Communication:  Disposition Plan: Mobilize  -PT/OT -wean oxygen as able -anticipate discharge home 24-48 hours  Consultants:  none  Antimicrobials:  None  Objective: Blood pressure 134/63, pulse 77, temperature (!) 97.5 F (36.4 C), temperature source Oral, resp. rate (!) 26, height 5\' 5"  (1.651 m), weight 73 kg, SpO2 92 %.  Intake/Output Summary (Last 24 hours) at 04/18/2019 1736 Last data filed at 04/18/2019 1712 Gross per 24 hour  Intake 1300 ml  Output 2900 ml  Net -1600 ml   Filed Weights   04/17/19 0230  Weight: 73 kg    Examination: General: No acute respiratory distress Lungs: Faint scattered crackles bilaterally with no wheezing Cardiovascular: Regular rate and rhythm without murmur gallop or rub normal S1 and S2 Abdomen: Nontender, nondistended, soft, bowel sounds positive, no rebound, no ascites, no appreciable mass Extremities: No significant cyanosis, clubbing, or edema bilateral lower extremities   CBC: Recent Labs  Lab 04/16/19 0118 04/17/19 0631 04/18/19 0115  WBC 4.6 11.3* 9.3  NEUTROABS 4.1 10.2* 8.6*  HGB 12.3* 13.0 13.0  HCT 34.1* 38.4* 38.5*  MCV 84.6 90.6 90.8  PLT 230 287 297   Basic Metabolic Panel: Recent Labs  Lab 04/16/19 0118 04/17/19 0631 04/18/19 0115  NA 132* 136 138  K 4.0 4.4 4.8  CL 99 99 100  CO2 24 27 28   GLUCOSE 264* 170* 200*  BUN 20 32* 32*  CREATININE 1.03 1.04 0.92  CALCIUM 7.6* 8.8* 8.8*  MG 2.0 2.2 2.2  PHOS  --  3.4 3.5   GFR: Estimated  Creatinine Clearance: 54.8 mL/min (by C-G formula based on SCr of 0.92 mg/dL).  Liver Function Tests: Recent Labs  Lab 04/15/19 1202 04/16/19 0118 04/17/19 0631 04/18/19 0115  AST 43* 38 30 31  ALT 22 20 23 29   ALKPHOS 53 51 56 52  BILITOT 1.5* 0.9 0.6 0.8  PROT 7.4 6.9 7.0 6.6  ALBUMIN 3.4* 3.0* 2.9* 3.0*    Recent Results (from the past 240 hour(s))  Blood Culture (routine x 2)     Status: None (Preliminary result)   Collection Time: 04/15/19 12:02 PM   Specimen: BLOOD  Result Value Ref Range Status    Specimen Description BLOOD LEFT ANTECUBITAL  Final   Special Requests   Final    BOTTLES DRAWN AEROBIC AND ANAEROBIC Blood Culture adequate volume   Culture   Final    NO GROWTH 3 DAYS Performed at [facility], [address], [place], [place] [address]    Report Status PENDING  Incomplete  Blood Culture (routine x 2)     Status: None (Preliminary result)   Collection Time: 04/15/19 12:06 PM   Specimen: BLOOD  Result Value Ref Range Status   Specimen Description BLOOD BLOOD LEFT HAND  Final   Special Requests   Final    BOTTLES DRAWN AEROBIC AND ANAEROBIC Blood Culture adequate volume   Culture   Final    NO GROWTH 3 DAYS Performed at [facility], [address] [place], [place] [address]    Report Status PENDING  Incomplete     Scheduled Meds: . vitamin C  1,000 mg Oral Daily  . cholecalciferol  2,000 Units Oral Daily  . dexamethasone (DECADRON) injection  6 mg Intravenous Q12H  . enoxaparin (LOVENOX) injection  40 mg Subcutaneous Q24H  . pantoprazole  40 mg Oral Daily  . sodium chloride flush  3 mL Intravenous Q12H  . zinc sulfate  220 mg Oral Daily   Continuous Infusions: . sodium chloride    . remdesivir 100 mg in NS 100 mL 100 mg (04/18/19 1032)     LOS: 1 day   Cherene Altes, MD [facility] Office  [phone] Pager - Text Page per Amion  If 7PM-7AM, please contact night-coverage per Amion 04/18/2019, 5:36 PM

## 2019-04-19 LAB — COMPREHENSIVE METABOLIC PANEL
ALT: 68 U/L — ABNORMAL HIGH (ref 0–44)
AST: 53 U/L — ABNORMAL HIGH (ref 15–41)
Albumin: 3 g/dL — ABNORMAL LOW (ref 3.5–5.0)
Alkaline Phosphatase: 58 U/L (ref 38–126)
Anion gap: 12 (ref 5–15)
BUN: 39 mg/dL — ABNORMAL HIGH (ref 8–23)
CO2: 27 mmol/L (ref 22–32)
Calcium: 9.1 mg/dL (ref 8.9–10.3)
Chloride: 96 mmol/L — ABNORMAL LOW (ref 98–111)
Creatinine, Ser: 1.12 mg/dL (ref 0.61–1.24)
GFR calc Af Amer: >60 mL/min (ref >60)
GFR calc non Af Amer: >60 mL/min (ref >60)
Glucose, Bld: 217 mg/dL — ABNORMAL HIGH (ref 70–99)
Potassium: 4.7 mmol/L (ref 3.5–5.1)
Sodium: 135 mmol/L (ref 135–145)
Total Bilirubin: 0.7 mg/dL (ref 0.3–1.2)
Total Protein: 7 g/dL (ref 6.5–8.1)

## 2019-04-19 LAB — CBC WITH DIFFERENTIAL/PLATELET
Abs Immature Granulocytes: 0.1 10*3/uL — ABNORMAL HIGH (ref 0.00–0.07)
Basophils Absolute: 0 10*3/uL (ref 0.0–0.1)
Basophils Relative: 0 %
Eosinophils Absolute: 0 10*3/uL (ref 0.0–0.5)
Eosinophils Relative: 0 %
HCT: 42 % (ref 39.0–52.0)
Hemoglobin: 14.1 g/dL (ref 13.0–17.0)
Immature Granulocytes: 1 %
Lymphocytes Relative: 5 %
Lymphs Abs: 0.5 10*3/uL — ABNORMAL LOW (ref 0.7–4.0)
MCH: 30.6 pg (ref 26.0–34.0)
MCHC: 33.6 g/dL (ref 30.0–36.0)
MCV: 91.1 fL (ref 80.0–100.0)
Monocytes Absolute: 0.2 10*3/uL (ref 0.1–1.0)
Monocytes Relative: 2 %
Neutro Abs: 8.6 10*3/uL — ABNORMAL HIGH (ref 1.7–7.7)
Neutrophils Relative %: 92 %
Platelets: 299 10*3/uL (ref 150–400)
RBC: 4.61 MIL/uL (ref 4.22–5.81)
RDW: 12.2 % (ref 11.5–15.5)
WBC: 9.4 10*3/uL (ref 4.0–10.5)
nRBC: 0 % (ref 0.0–0.2)

## 2019-04-19 LAB — D-DIMER, QUANTITATIVE: D-Dimer, Quant: 0.81 ug/mL-FEU — ABNORMAL HIGH (ref 0.00–0.50)

## 2019-04-19 LAB — GLUCOSE, CAPILLARY
Glucose-Capillary: 400 mg/dL — ABNORMAL HIGH (ref 70–99)
Glucose-Capillary: 417 mg/dL — ABNORMAL HIGH (ref 70–99)

## 2019-04-19 LAB — PHOSPHORUS: Phosphorus: 4.3 mg/dL (ref 2.5–4.6)

## 2019-04-19 LAB — C-REACTIVE PROTEIN: CRP: 4.6 mg/dL — ABNORMAL HIGH (ref <1.0)

## 2019-04-19 LAB — MAGNESIUM: Magnesium: 1.8 mg/dL (ref 1.7–2.4)

## 2019-04-19 MED ORDER — DEXAMETHASONE 6 MG PO TABS
6.0000 mg | ORAL_TABLET | Freq: Every day | ORAL | Status: DC
  Administered 2019-04-20: 08:00:00 6 mg via ORAL
  Filled 2019-04-19: qty 1

## 2019-04-19 MED ORDER — INSULIN GLARGINE 100 UNIT/ML ~~LOC~~ SOLN
10.0000 [IU] | Freq: Every day | SUBCUTANEOUS | Status: DC
  Administered 2019-04-19 – 2019-04-20 (×2): 10 [IU] via SUBCUTANEOUS
  Filled 2019-04-19 (×2): qty 0.1

## 2019-04-19 MED ORDER — INSULIN ASPART 100 UNIT/ML ~~LOC~~ SOLN: 0.0000 [IU] | Freq: Every day | SUBCUTANEOUS | Status: DC

## 2019-04-19 MED ORDER — INSULIN ASPART 100 UNIT/ML ~~LOC~~ SOLN
0.0000 [IU] | Freq: Three times a day (TID) | SUBCUTANEOUS | Status: DC
  Administered 2019-04-19: 16:00:00 20 [IU] via SUBCUTANEOUS
  Administered 2019-04-20: 08:00:00 4 [IU] via SUBCUTANEOUS

## 2019-04-19 MED ORDER — INSULIN ASPART 100 UNIT/ML ~~LOC~~ SOLN
0.0000 [IU] | Freq: Three times a day (TID) | SUBCUTANEOUS | Status: DC
  Administered 2019-04-19: 13:00:00 9 [IU] via SUBCUTANEOUS

## 2019-04-19 NOTE — Progress Notes (Signed)
Arthur Orozco  FXJ:[id number] DOB: 1936-03-28 DOA: 04/17/2019 PCP: Rayetta Humphrey, MD    Brief Narrative:  83 year old with a history of GERD and arthritis who presented to the [facility] ED 12/17 with a 2-week history of cough and 4 days of progressively worsening shortness of breath.  His wife was known to be Covid positive and had experienced approximately 2 weeks of flulike illness.  In the ED the patient himself was afebrile but was found to be saturating in the mid 80s on room air and tachypneic.  CXR noted multifocal atypical pneumonia and Covid testing was positive.  Sodium was also noted to be 130.  The patient was treated with IV steroids, remdesivir, and Actemra and transferred to [facility] campus 12/19.  Significant Events: 12/17 admit to [facility] via ED 12/19 transfer to [facility]  COVID-19 specific Treatment: Decadron 12/18 > Remdesivir 12/17 > 12/21 Actemra 12/18  Subjective: No new complaints.  Anxious to get up and get moving.  Anxious to go home.  Denies chest pain nausea vomiting or abdominal pain.  Assessment & Plan:  COVID Pneumonia -acute hypoxic respiratory failure in no significant distress clinically -patient needs to ambulate/set up -push pulmonary hygiene -wean oxygen as able -possible discharge 24-36 hours  Recent Labs  Lab 04/15/19 1202 04/16/19 0118 04/17/19 0631 04/18/19 0115 04/19/19 0410  DDIMER  --   --  1.20* 0.83* 0.81*  FERRITIN 650* 597*  --   --   --   CRP 19.3* 19.6* 11.2* 7.2* 4.6*  ALT 22 20 23 29  68*  PROCALCITON 0.23  --  0.20  --   --     Hyperglycemia Blood glucose significantly elevated on metabolic panels -follow CBGs and administer sliding scale insulin -may simply be due to Decadron dosing -check A1c  Hyponatremia Likely simply related to volume depletion -corrected with sodium now normal  Transaminitis Mild -likely either due to Covid itself or Remdesivir -recheck in a.m.  Sinus bradycardia Transient with heart  rate now consistently 65-90   DVT prophylaxis: Lovenox Code Status: FULL CODE Family Communication:  Disposition Plan: Mobilize - PT/OT - wean oxygen as able -anticipate discharge home 12/22  Consultants:  none  Antimicrobials:  None  Objective: Blood pressure 124/65, pulse 87, temperature 98.4 F (36.9 C), temperature source Oral, resp. rate (!) 26, height 5\' 5"  (1.651 m), weight 73 kg, SpO2 90 %.  Intake/Output Summary (Last 24 hours) at 04/19/2019 0941 Last data filed at 04/19/2019 0400 Gross per 24 hour  Intake 970 ml  Output 2850 ml  Net -1880 ml   Filed Weights   04/17/19 0230  Weight: 73 kg    Examination: General: No acute respiratory distress Lungs: Faint scattered crackles bilaterally -no change Cardiovascular: RRR without murmur or rub Abdomen: NT/ND, soft, BS positive Extremities: No significant cyanosis, clubbing, or edema bilateral lower extremities   CBC: Recent Labs  Lab 04/17/19 0631 04/18/19 0115 04/19/19 0410  WBC 11.3* 9.3 9.4  NEUTROABS 10.2* 8.6* 8.6*  HGB 13.0 13.0 14.1  HCT 38.4* 38.5* 42.0  MCV 90.6 90.8 91.1  PLT 287 297 299   Basic Metabolic Panel: Recent Labs  Lab 04/17/19 0631 04/18/19 0115 04/19/19 0410  NA 136 138 135  K 4.4 4.8 4.7  CL 99 100 96*  CO2 27 28 27   GLUCOSE 170* 200* 217*  BUN 32* 32* 39*  CREATININE 1.04 0.92 1.12  CALCIUM 8.8* 8.8* 9.1  MG 2.2 2.2 1.8  PHOS 3.4 3.5 4.3  GFR: Estimated Creatinine Clearance: 45 mL/min (by C-G formula based on SCr of 1.12 mg/dL).  Liver Function Tests: Recent Labs  Lab 04/16/19 0118 04/17/19 0631 04/18/19 0115 04/19/19 0410  AST 38 30 31 53*  ALT 20 23 29  68*  ALKPHOS 51 56 52 58  BILITOT 0.9 0.6 0.8 0.7  PROT 6.9 7.0 6.6 7.0  ALBUMIN 3.0* 2.9* 3.0* 3.0*    Recent Results (from the past 240 hour(s))  Blood Culture (routine x 2)     Status: None (Preliminary result)   Collection Time: 04/15/19 12:02 PM   Specimen: BLOOD  Result Value Ref Range Status     Specimen Description BLOOD LEFT ANTECUBITAL  Final   Special Requests   Final    BOTTLES DRAWN AEROBIC AND ANAEROBIC Blood Culture adequate volume   Culture   Final    NO GROWTH 4 DAYS Performed at [facility], [address], [place], [place] [address]    Report Status PENDING  Incomplete  Blood Culture (routine x 2)     Status: None (Preliminary result)   Collection Time: 04/15/19 12:06 PM   Specimen: BLOOD  Result Value Ref Range Status   Specimen Description BLOOD BLOOD LEFT HAND  Final   Special Requests   Final    BOTTLES DRAWN AEROBIC AND ANAEROBIC Blood Culture adequate volume   Culture   Final    NO GROWTH 4 DAYS Performed at [facility], [address] [place], [place] [address]    Report Status PENDING  Incomplete     Scheduled Meds: . vitamin C  1,000 mg Oral Daily  . cholecalciferol  2,000 Units Oral Daily  . dexamethasone (DECADRON) injection  6 mg Intravenous Q12H  . enoxaparin (LOVENOX) injection  40 mg Subcutaneous Q24H  . pantoprazole  40 mg Oral Daily  . zinc sulfate  220 mg Oral Daily   Continuous Infusions: . remdesivir 100 mg in NS 100 mL Stopped (04/18/19 1110)     LOS: 2 days   Cherene Altes, MD [facility] Office  [phone] Pager - Text Page per Amion  If 7PM-7AM, please contact night-coverage per Amion 04/19/2019, 9:41 AM

## 2019-04-19 NOTE — Progress Notes (Signed)
Pt refused lovenox, overnight MD paged and made aware.

## 2019-04-19 NOTE — Evaluation (Signed)
Physical Therapy Evaluation Patient Details Name: Arthur Orozco MRN: [id number] DOB: 1936-03-09 Today's Date: 04/19/2019   History of Present Illness  83 year old with a history of GERD and arthritis who presented to the [facility] ED 12/17 with a 2-week history of cough and 4 days of progressively worsening shortness of breath.  His wife was known to be Covid positive and had experienced approximately 2 weeks of flulike illness.  In the ED the patient himself was afebrile but was found to be saturating in the mid 80s on room air and tachypneic.  CXR noted multifocal atypical pneumonia and Covid testing was positive.  Sodium was also noted to be 130.  The patient was treated with IV steroids, remdesivir, and Actemra and transferred to [facility] campus 12/19.  Clinical Impression   Pt admitted with above diagnosis. Patient very independent PTA--states he is typically outside all day staying busy, almost never sits down. Denies fall in past 6 months. Only gets short of breath when walking [place] the hill from the [place]. He desaturated to 85% walking on room air after 50 ft. Progressively incr O2 to 4L to maintain sats >=88% while walking an additional 250 ft with standing rest x 1. Pt currently with functional limitations due to the deficits listed below (see PT Problem List). Pt will benefit from skilled PT to increase their independence and safety with mobility to allow discharge to the venue listed below.       Follow Up Recommendations No PT follow up    Equipment Recommendations  None recommended by PT    Recommendations for Other Services       Precautions / Restrictions Precautions Precautions: Fall;Other (comment) Precaution Comments: monitor sats Restrictions Weight Bearing Restrictions: No      Mobility  Bed Mobility Overal bed mobility: Independent                Transfers Overall transfer level: Needs assistance Equipment used: None Transfers: Sit  to/from Stand Sit to Stand: Supervision         General transfer comment: supervision to manage multiple lines/monitor/oxygen  Ambulation/Gait Ambulation/Gait assistance: Supervision;Min guard Gait Distance (Feet): 300 Feet Assistive device: None Gait Pattern/deviations: WFL(Within Functional Limits)     General Gait Details: initial walk in room on room air (on RA on arrival)  with sats >90%; donned mask and walked 30 ft with sats dropping to 85%; standing recovery @1 -2L with pt still dropping; incr to 3L and sats 88-91% x 150 ft; decr to 86% and tried to incr with standing rest break and pursed lip breathing; required 4L for 150 ft back to room at slower velocity  Stairs            Wheelchair Mobility    Modified Rankin (Stroke Patients Only)       Balance Overall balance assessment: Mild deficits observed, not formally tested(initially in room, indep recovery; none in hall)                                           Pertinent Vitals/Pain Pain Assessment: No/denies pain    Home Living Family/patient expects to be discharged to:: Private residence Living Arrangements: Spouse/significant other Available Help at Discharge: Family Type of Home: House Home Access: Stairs to enter   [phone] of Steps: 4 Home Layout: One level Home Equipment: Shower seat;Other (comment)(walking sticks)  Prior Function Level of Independence: Independent         Comments:  Pt does not ambulate with an assistive device, but reports occasional use of walking stick outside. Pt still drives. Pt reports 0 falls in the last 6 months. Pt does not use oxygen at home.     Hand Dominance   Dominant Hand: Right    Extremity/Trunk Assessment   Upper Extremity Assessment Upper Extremity Assessment: Defer to OT evaluation    Lower Extremity Assessment Lower Extremity Assessment: Overall WFL for tasks assessed    Cervical / Trunk  Assessment Cervical / Trunk Assessment: Normal  Communication   Communication: HOH  Cognition Arousal/Alertness: Awake/alert Behavior During Therapy: WFL for tasks assessed/performed Overall Cognitive Status: Within Functional Limits for tasks assessed                                        General Comments      Exercises     Assessment/Plan    PT Assessment Patient needs continued PT services  PT Problem List Decreased activity tolerance;Decreased balance;Decreased safety awareness;Cardiopulmonary status limiting activity       PT Treatment Interventions Gait training;Functional mobility training;Therapeutic activities;Therapeutic exercise;Balance training;Patient/family education    PT Goals (Current goals can be found in the Care Plan section)  Acute Rehab PT Goals Patient Stated Goal: To go home tomorrow PT Goal Formulation: With patient Time For Goal Achievement: 05/03/19 Potential to Achieve Goals: Good    Frequency Min 3X/week   Barriers to discharge        Co-evaluation               AM-PAC PT "6 Clicks" Mobility  Outcome Measure Help needed turning from your back to your side while in a flat bed without using bedrails?: None Help needed moving from lying on your back to sitting on the side of a flat bed without using bedrails?: None Help needed moving to and from a bed to a chair (including a wheelchair)?: None Help needed standing up from a chair using your arms (e.g., wheelchair or bedside chair)?: None Help needed to walk in hospital room?: A Little Help needed climbing 3-5 steps with a railing? : A Little 6 Click Score: 22    End of Session Equipment Utilized During Treatment: Oxygen Activity Tolerance: Treatment limited secondary to medical complications (Comment) Patient left: in chair;with call bell/phone within reach Nurse Communication: Mobility status;Other (comment)(oxygen decr 6L to 5L and can likely go lower ) PT Visit  Diagnosis: Difficulty in walking, not elsewhere classified (R26.2)    Time: 4696-2952 PT Time Calculation (min) (ACUTE ONLY): 21 min   Charges:   PT Evaluation $PT Eval Low Complexity: 1 Low           Arby Barrette, PT Pager [phone]   Rexanne Mano 04/19/2019, 2:23 PM

## 2019-04-19 NOTE — Plan of Care (Signed)

## 2019-04-19 NOTE — Progress Notes (Signed)
SATURATION QUALIFICATIONS: (This note is used to comply with regulatory documentation for home oxygen)  Patient Saturations on Room Air at Rest = 96%  Patient Saturations on Room Air while Ambulating = 85%  Patient Saturations on 3 and then 4 Liters of oxygen while Ambulating x 300 ft = 88%  Please briefly explain why patient needs home oxygen:  To maintain oxygen saturation during functional activity.   Arby Barrette, PT Pager [phone]

## 2019-04-19 NOTE — Progress Notes (Signed)
Occupational Therapy Evaluation Patient Details Name: Arthur Orozco MRN: [id number] DOB: [birth year] Today's Date: 04/19/2019    History of Present Illness 83 year old with a history of GERD and arthritis who presented to the [facility] ED 12/17 with a 2-week history of cough and 4 days of progressively worsening shortness of breath.  His wife was known to be Covid positive and had experienced approximately 2 weeks of flulike illness.  In the ED the patient himself was afebrile but was found to be saturating in the mid 80s on room air and tachypneic.  CXR noted multifocal atypical pneumonia and Covid testing was positive.  Sodium was also noted to be 130.  The patient was treated with IV steroids, remdesivir, and Actemra and transferred to [facility] campus 12/19.   Clinical Impression   PTA pt lived with wife, independent in all ADL, IADL, and mobility tasks. Pt reports ambulating without an assistive device, however does use a walking stick occasionally outside. Pt does not use oxygen at home and is currently on 8L HFNC. Pt currently independent to supervision for self-care and functional transfer tasks. Pt able to ambulate to/from bathroom and complete toileting as well as hygiene tasks at the sink. SpO2 decreased to 82% on 8L HFNC with pt requiring 2 minute seated rest break for return to 95%. 2/4 DOE. No reports of anxiety related to shortness of breath. Educated and provided pt with handouts regarding pursed lip breathing and energy conservation. Pt demonstrates decreased strength, endurance, balance, standing tolerance, and activity tolerance impacting ability to complete self-care and functional transfer tasks. Recommend skilled OT services to address above deficits in order to promote function and prevent further decline.     Follow Up Recommendations  No OT follow up    Equipment Recommendations  None recommended by OT    Recommendations for Other Services       Precautions /  Restrictions Precautions Precautions: Fall;Other (comment) Precaution Comments: 8L HFNC Restrictions Weight Bearing Restrictions: No      Mobility Bed Mobility Overal bed mobility: Independent             General bed mobility comments: HOB slightly elevated, without use of bedrails  Transfers Overall transfer level: Needs assistance Equipment used: None Transfers: Sit to/from Stand Sit to Stand: Supervision              Balance Overall balance assessment: Mild deficits observed, not formally tested                                         ADL either performed or assessed with clinical judgement   ADL Overall ADL's : Needs assistance/impaired Eating/Feeding: Independent;Sitting   Grooming: Supervision/safety;Standing   Upper Body Bathing: Supervision/ safety;Sitting   Lower Body Bathing: Supervison/ safety;Sit to/from stand;Sitting/lateral leans   Upper Body Dressing : Supervision/safety;Sitting   Lower Body Dressing: Supervision/safety;Sit to/from stand;Sitting/lateral leans   Toilet Transfer: Retail banker;Ambulation   Toileting- Clothing Manipulation and Hygiene: Supervision/safety;Sit to/from stand;Sitting/lateral lean       Functional mobility during ADLs: Supervision/safety General ADL Comments: Pt able to ambulate to/from bathroom. Noted 0 instances of LOB.      Vision         Perception     Praxis      Pertinent Vitals/Pain Pain Assessment: No/denies pain     Hand Dominance Right   Extremity/Trunk Assessment Upper Extremity Assessment Upper Extremity Assessment:  Overall [facility] for tasks assessed   Lower Extremity Assessment Lower Extremity Assessment: Defer to PT evaluation       Communication Communication Communication: HOH   Cognition Arousal/Alertness: Awake/alert Behavior During Therapy: WFL for tasks assessed/performed Overall Cognitive Status: Within Functional Limits for tasks  assessed                                     General Comments  Pt on 8L HFNC. SpO2 dropped to 82% following ambulation to/from bathroom, toileting, and hygiene tasks at the sink. Pt required ~2 min seated rest break for return back to 95%. Educated and provided pt with handouts regarding energy conservation and pursed lip breathing.    Exercises Exercises: Other exercises Other Exercises Other Exercises: Incentive spirometer x 10 with min cues on technique. Pulling 1715mL Other Exercises: Flutter valve x 10   Shoulder Instructions      Home Living Family/patient expects to be discharged to:: Private residence Living Arrangements: Spouse/significant other Available Help at Discharge: Family Type of Home: House Home Access: Stairs to enter Technical brewer of Steps: 4   Home Layout: One level     Bathroom Shower/Tub: Occupational psychologist: Handicapped height     Home Equipment: Civil engineer, contracting;Other (comment)(walking sticks)          Prior Functioning/Environment Level of Independence: Independent        Comments: Pt independent in ADLs, IADLs, and mobility. Pt does not ambulate with an assistive device, but reports occasional use of walking stick outside. Pt still drives. Pt reports 0 falls in the last 6 months. Pt does not use oxygen at home.        OT Problem List: Decreased strength;Decreased activity tolerance;Impaired balance (sitting and/or standing);Cardiopulmonary status limiting activity      OT Treatment/Interventions: Self-care/ADL training;Therapeutic exercise;Neuromuscular education;Energy conservation;DME and/or AE instruction;Therapeutic activities;Patient/family education;Balance training    OT Goals(Current goals can be found in the care plan section) Acute Rehab OT Goals Patient Stated Goal: To go home Time For Goal Achievement: 05/03/19 Potential to Achieve Goals: Good ADL Goals Pt Will Perform Grooming:  Independently;standing Pt Will Perform Lower Body Bathing: Independently;sit to/from stand Pt Will Transfer to Toilet: Independently;ambulating Pt Will Perform Toileting - Clothing Manipulation and hygiene: Independently;sit to/from stand Additional ADL Goal #1: Pt to recall and verbalize 3 energy conservation strategies with 0 verbal cues. Additional ADL Goal #2: Pt to tolerate standing up to 5 minutes independently, in preparation for ADLs.  OT Frequency: Min 2X/week   Barriers to D/C:            Co-evaluation              AM-PAC OT "6 Clicks" Daily Activity     Outcome Measure Help from another person eating meals?: None Help from another person taking care of personal grooming?: A Little Help from another person toileting, which includes using toliet, bedpan, or urinal?: A Little Help from another person bathing (including washing, rinsing, drying)?: A Little Help from another person to put on and taking off regular upper body clothing?: A Little Help from another person to put on and taking off regular lower body clothing?: A Little 6 Click Score: 19   End of Session Equipment Utilized During Treatment: Oxygen Nurse Communication: Mobility status  Activity Tolerance: Patient limited by fatigue(Limited by SOB) Patient left: in chair;with call bell/phone within reach  OT Visit Diagnosis:  Unsteadiness on feet (R26.81);Muscle weakness (generalized) (M62.81)                Time: [id number] OT Time Calculation (min): 39 min Charges:  OT General Charges $OT Visit: 1 Visit OT Evaluation $OT Eval Moderate Complexity: 1 Mod OT Treatments $Self Care/Home Management : 8-22 mins $Therapeutic Activity: 8-22 mins  Peterson AoMarin Brentley Landfair OTR/L [phone]   Peterson AoMarin Teah Votaw 04/19/2019, 8:53 AM

## 2019-04-20 LAB — CBC WITH DIFFERENTIAL/PLATELET
Abs Immature Granulocytes: 0.2 10*3/uL — ABNORMAL HIGH (ref 0.00–0.07)
Basophils Absolute: 0.1 10*3/uL (ref 0.0–0.1)
Basophils Relative: 0 %
Eosinophils Absolute: 0 10*3/uL (ref 0.0–0.5)
Eosinophils Relative: 0 %
HCT: 40.8 % (ref 39.0–52.0)
Hemoglobin: 13.7 g/dL (ref 13.0–17.0)
Immature Granulocytes: 2 %
Lymphocytes Relative: 6 %
Lymphs Abs: 0.7 10*3/uL (ref 0.7–4.0)
MCH: 30.2 pg (ref 26.0–34.0)
MCHC: 33.6 g/dL (ref 30.0–36.0)
MCV: 90.1 fL (ref 80.0–100.0)
Monocytes Absolute: 0.3 10*3/uL (ref 0.1–1.0)
Monocytes Relative: 3 %
Neutro Abs: 10.8 10*3/uL — ABNORMAL HIGH (ref 1.7–7.7)
Neutrophils Relative %: 89 %
Platelets: 319 10*3/uL (ref 150–400)
RBC: 4.53 MIL/uL (ref 4.22–5.81)
RDW: 12.1 % (ref 11.5–15.5)
WBC: 12.1 10*3/uL — ABNORMAL HIGH (ref 4.0–10.5)
nRBC: 0 % (ref 0.0–0.2)

## 2019-04-20 LAB — CULTURE, BLOOD (ROUTINE X 2)
Culture: NO GROWTH
Culture: NO GROWTH
Special Requests: ADEQUATE
Special Requests: ADEQUATE

## 2019-04-20 LAB — HEMOGLOBIN A1C
Hgb A1c MFr Bld: 6.5 % — ABNORMAL HIGH (ref 4.8–5.6)
Mean Plasma Glucose: 139.85 mg/dL

## 2019-04-20 LAB — COMPREHENSIVE METABOLIC PANEL
ALT: 59 U/L — ABNORMAL HIGH (ref 0–44)
AST: 34 U/L (ref 15–41)
Albumin: 3.2 g/dL — ABNORMAL LOW (ref 3.5–5.0)
Alkaline Phosphatase: 51 U/L (ref 38–126)
Anion gap: 8 (ref 5–15)
BUN: 40 mg/dL — ABNORMAL HIGH (ref 8–23)
CO2: 33 mmol/L — ABNORMAL HIGH (ref 22–32)
Calcium: 9 mg/dL (ref 8.9–10.3)
Chloride: 94 mmol/L — ABNORMAL LOW (ref 98–111)
Creatinine, Ser: 0.92 mg/dL (ref 0.61–1.24)
GFR calc Af Amer: >60 mL/min (ref >60)
GFR calc non Af Amer: >60 mL/min (ref >60)
Glucose, Bld: 157 mg/dL — ABNORMAL HIGH (ref 70–99)
Potassium: 4.4 mmol/L (ref 3.5–5.1)
Sodium: 135 mmol/L (ref 135–145)
Total Bilirubin: 1 mg/dL (ref 0.3–1.2)
Total Protein: 6.6 g/dL (ref 6.5–8.1)

## 2019-04-20 LAB — GLUCOSE, CAPILLARY
Glucose-Capillary: 134 mg/dL — ABNORMAL HIGH (ref 70–99)
Glucose-Capillary: 162 mg/dL — ABNORMAL HIGH (ref 70–99)

## 2019-04-20 LAB — D-DIMER, QUANTITATIVE: D-Dimer, Quant: 0.84 ug/mL-FEU — ABNORMAL HIGH (ref 0.00–0.50)

## 2019-04-20 LAB — C-REACTIVE PROTEIN: CRP: 2.9 mg/dL — ABNORMAL HIGH (ref <1.0)

## 2019-04-20 LAB — MAGNESIUM: Magnesium: 2 mg/dL (ref 1.7–2.4)

## 2019-04-20 NOTE — TOC Transition Note (Signed)
Transition of Care [facility]) - CM/SW Discharge Note   Patient Details  Name: Arthur Orozco MRN: [id number] Date of Birth: 09-02-1935  Transition of Care [facility]) CM/SW Contact:  Shade Flood, LCSW Phone Number: 04/20/2019, 12:07 PM   Clinical Narrative:     Pt stable for dc per MD. Pt has orders for Home O2. Arranged with [employer]. AC to bring portable to pt's room. [employer] will deliver home set up later today. Spoke with RN and Agricultural consultant regarding above. Pt's wife at [facility] to take pt home.There are no other TOC needs for dc.  Final next level of care: Home/Self Care Barriers to Discharge: Barriers Resolved   Patient Goals and CMS Choice        Discharge Placement                       Discharge Plan and Services     Post Acute Care Choice: Durable Medical Equipment          DME Arranged: Oxygen DME Agency: [facility] Date DME Agency Contacted: 04/20/19 Time DME Agency Contacted: 1207 Representative spoke with at DME Agency: Magda Paganini            Social Determinants of Health ([place]) Interventions     Readmission Risk Interventions No flowsheet data found.

## 2019-04-20 NOTE — Discharge Summary (Signed)
DISCHARGE SUMMARY  Arthur Orozco  MR#: [id number]  DOB:1935-11-30  Date of Admission: 04/17/2019 Date of Discharge: 04/20/2019  Attending Physician:Halil Rentz Silvestre Gunner, MD  Patient's URK:YHCWCB, Marylin Crosby, MD  Consults: none   Disposition: discharge home   Date of Positive COVID Test: 04/15/2019  Date Quarantine Ends: 05/06/2019  COVID-19 specific Treatment: Decadron 12/18 > Remdesivir 12/18 > 12/21 Solumedrol 12/17 Actemra 12/18  Follow-up Appts: Follow-up Information    Rayetta Humphrey, MD Follow up in 1 week(s).   Specialty: Family Medicine Contact information: [address] [place] [address] [phone]           Tests Needing Follow-up: -reassess for possible newly diagnosed DM -assess O2 sats w/ exertion and determine if patient can stop supplemental O2  Discharge Diagnoses: COVID Pneumonia Acute hypoxic respiratory failure Hyperglycemia - possible newly diagnosed uncontrolled DM w/ hyperglycemia Hyponatremia Transaminitis Sinus bradycardia  Initial presentation: 83yo with a history of GERD and arthritis who presented to the [facility] ED 12/17 with a 2-week history of cough and 4 days of progressively worsening shortness of breath. His wife was known to be Covid positive and had experienced approximately 2 weeks of flulike illness. In the ED the patient himself was afebrile but was found to be saturating in the mid 80s on room air and tachypneic. CXR noted multifocal atypical pneumonia and Covid testing was positive. Sodium was also noted to be 130. The patient was treated with IV steroids, remdesivir, and Actemra and transferred to [facility] campus 12/19.  Hospital Course: 12/17 admit to [facility] via ED 12/19 transfer to [facility] 12/22 discharged home   COVID Pneumonia -acute hypoxic respiratory failure in no significant distress clinically - patient needs to ambulate/set up - push pulmonary hygiene - wean oxygen as able - stable on RA  at rest, but requires 4L [place] support w/ exertion to keep sats 88% or >  Hyperglycemia Blood glucose significantly elevated on metabolic panels - CBGs 400s on decadron without treatment - A1c 6.5 but I am hesitant to make a formal diagnosis of DM in the setting of an acute viral illness that may itself increase CBG, as well as decadron dosing - shorten decadron course to avoid uncontrolled CBG at home - will need outpt monitoring in f/u once viral illness resolved and decadron course completed   Hyponatremia Likely simply related to volume depletion - corrected with sodium normal at time of discharge   Transaminitis Mild -likely either due to Covid itself or Remdesivir - nearly resolved at time of discharge  Recent Labs  Lab 04/16/19 0118 04/17/19 0631 04/18/19 0115 04/19/19 0410 04/20/19 0230  AST 38 30 31 53* 34  ALT 20 23 29  68* 59*  ALKPHOS 51 56 52 58 51  BILITOT 0.9 0.6 0.8 0.7 1.0  PROT 6.9 7.0 6.6 7.0 6.6  ALBUMIN 3.0* 2.9* 3.0* 3.0* 3.2*    Sinus bradycardia Paroxysmal w/ HR dipping into 50s at times, but with patient asymptomatic and w/ preserved BP  Allergies as of 04/20/2019      Reactions   Doxycycline Swelling   Unsure if from doxycyline or lymphadenopathy per ER. Ideally we would avoid this ABX if possible.       Medication List    TAKE these medications   acetaminophen 500 MG tablet Commonly known as: TYLENOL Take 1,000 mg by mouth every 6 (six) hours as needed for mild pain.   Fish Oil 1200 MG Caps Take 1,200 mg by mouth daily.  vitamin C 1000 MG tablet Take 1,000 mg by mouth daily.   Vitamin D 50 MCG (2000 UT) Caps Take 2,000 Units by mouth daily.   zinc gluconate 50 MG tablet Take 50 mg by mouth daily.            Durable Medical Equipment  (From admission, onward)         Start     Ordered   04/19/19 1728  For home use only DME oxygen  Once    Comments: Plan for D/C home 12/22  Question Answer Comment  Length of Need 6 Months    Mode or (Route) Nasal cannula   Liters per Minute 4   Frequency Continuous (stationary and portable oxygen unit needed)   Oxygen conserving device Yes   Oxygen delivery system Gas      04/19/19 1727          Day of Discharge BP 132/67 (BP Location: Left Arm)   Pulse 63   Temp (!) 97.4 F (36.3 C) (Oral)   Resp 20   Ht 5\' 5"  (1.651 m)   Wt 73 kg   SpO2 91%   BMI 26.78 kg/m   Physical Exam: General: No acute respiratory distress Lungs: Clear to auscultation bilaterally without wheezes or crackles Cardiovascular: Regular rate and rhythm without murmur gallop or rub normal S1 and S2 Abdomen: Nontender, nondistended, soft, bowel sounds positive, no rebound, no ascites, no appreciable mass Extremities: No significant cyanosis, clubbing, or edema bilateral lower extremities  Basic Metabolic Panel: Recent Labs  Lab 04/16/19 0118 04/17/19 0631 04/18/19 0115 04/19/19 0410 04/20/19 0230  NA 132* 136 138 135 135  K 4.0 4.4 4.8 4.7 4.4  CL 99 99 100 96* 94*  CO2 24 27 28 27  33*  GLUCOSE 264* 170* 200* 217* 157*  BUN 20 32* 32* 39* 40*  CREATININE 1.03 1.04 0.92 1.12 0.92  CALCIUM 7.6* 8.8* 8.8* 9.1 9.0  MG 2.0 2.2 2.2 1.8 2.0  PHOS  --  3.4 3.5 4.3  --     Liver Function Tests: Recent Labs  Lab 04/16/19 0118 04/17/19 0631 04/18/19 0115 04/19/19 0410 04/20/19 0230  AST 38 30 31 53* 34  ALT 20 23 29  68* 59*  ALKPHOS 51 56 52 58 51  BILITOT 0.9 0.6 0.8 0.7 1.0  PROT 6.9 7.0 6.6 7.0 6.6  ALBUMIN 3.0* 2.9* 3.0* 3.0* 3.2*   CBC: Recent Labs  Lab 04/16/19 0118 04/17/19 0631 04/18/19 0115 04/19/19 0410 04/20/19 0230  WBC 4.6 11.3* 9.3 9.4 12.1*  NEUTROABS 4.1 10.2* 8.6* 8.6* 10.8*  HGB 12.3* 13.0 13.0 14.1 13.7  HCT 34.1* 38.4* 38.5* 42.0 40.8  MCV 84.6 90.6 90.8 91.1 90.1  PLT 230 287 297 299 319    CBG: Recent Labs  Lab 04/19/19 1248 04/19/19 1505 04/20/19 0723  GLUCAP 400* 417* 162*    Recent Results (from the past 240 hour(s))  Blood Culture  (routine x 2)     Status: None   Collection Time: 04/15/19 12:02 PM   Specimen: BLOOD  Result Value Ref Range Status   Specimen Description BLOOD LEFT ANTECUBITAL  Final   Special Requests   Final    BOTTLES DRAWN AEROBIC AND ANAEROBIC Blood Culture adequate volume   Culture   Final    NO GROWTH 5 DAYS Performed at [facility], [address] [place], [place] [id number]    Report Status 04/20/2019 FINAL  Final  Blood Culture (routine x 2)  Status: None   Collection Time: 04/15/19 12:06 PM   Specimen: BLOOD  Result Value Ref Range Status   Specimen Description BLOOD BLOOD LEFT HAND  Final   Special Requests   Final    BOTTLES DRAWN AEROBIC AND ANAEROBIC Blood Culture adequate volume   Culture   Final    NO GROWTH 5 DAYS Performed at [facility], [address] [place], [place] [address]    Report Status 04/20/2019 FINAL  Final     Time spent in discharge (includes decision making & examination of pt): 35 minutes  04/20/2019, 10:37 AM   Cherene Altes, MD [facility] Office  [phone]

## 2019-04-20 NOTE — Progress Notes (Signed)
Ambulated pt to bath room and back pt o2 sats maintained about 90 with no sob

## 2019-04-20 NOTE — Discharge Instructions (Signed)
Person Under Monitoring Name: Arthur Orozco  Location: [address] [place] [address]   Infection Prevention Recommendations for Individuals Confirmed to have, or Being Evaluated for, 2019 Novel Coronavirus (COVID-19) Infection Who Receive Care at Home  Individuals who are confirmed to have, or are being evaluated for, COVID-19 should follow the prevention steps below until a healthcare provider or local or state health department says they can return to normal activities.  Stay home except to get medical care You should restrict activities outside your home, except for getting medical care. Do not go to work, school, or public areas, and do not use public transportation or taxis.  Call ahead before visiting your doctor Before your medical appointment, call the healthcare provider and tell them that you have, or are being evaluated for, COVID-19 infection. This will help the healthcare provider's office take steps to keep other people from getting infected. Ask your healthcare provider to call the local or state health department.  Monitor your symptoms Seek prompt medical attention if your illness is worsening (e.g., difficulty breathing). Before going to your medical appointment, call the healthcare provider and tell them that you have, or are being evaluated for, COVID-19 infection. Ask your healthcare provider to call the local or state health department.  Wear a facemask You should wear a facemask that covers your nose and mouth when you are in the same room with other people and when you visit a healthcare provider. People who live with or visit you should also wear a facemask while they are in the same room with you.  Separate yourself from other people in your home As much as possible, you should stay in a different room from other people in your home. Also, you should use a separate bathroom, if available.  Avoid sharing household items You should not  share dishes, drinking glasses, cups, eating utensils, towels, bedding, or other items with other people in your home. After using these items, you should wash them thoroughly with soap and water.  Cover your coughs and sneezes Cover your mouth and nose with a tissue when you cough or sneeze, or you can cough or sneeze into your sleeve. Throw used tissues in a lined trash can, and immediately wash your hands with soap and water for at least 20 seconds or use an alcohol-based hand rub.  Wash your [employer] your hands often and thoroughly with soap and water for at least 20 seconds. You can use an alcohol-based hand sanitizer if soap and water are not available and if your hands are not visibly dirty. Avoid touching your eyes, nose, and mouth with unwashed hands.   Prevention Steps for Caregivers and Household Members of Individuals Confirmed to have, or Being Evaluated for, COVID-19 Infection Being Cared for in the Home  If you live with, or provide care at home for, a person confirmed to have, or being evaluated for, COVID-19 infection please follow these guidelines to prevent infection:  Follow healthcare provider's instructions Make sure that you understand and can help the patient follow any healthcare provider instructions for all care.  Provide for the patient's basic needs You should help the patient with basic needs in the home and provide support for getting groceries, prescriptions, and other personal needs.  Monitor the patient's symptoms If they are getting sicker, call his or her medical provider and tell them that the patient has, or is being evaluated for, COVID-19 infection. This will help the healthcare provider's  office take steps to keep other people from getting infected. Ask the healthcare provider to call the local or state health department.  Limit the number of people who have contact with the patient  If possible, have only one caregiver for the  patient.  Other household members should stay in another home or place of residence. If this is not possible, they should stay  in another room, or be separated from the patient as much as possible. Use a separate bathroom, if available.  Restrict visitors who do not have an essential need to be in the home.  Keep older adults, very young children, and other sick people away from the patient Keep older adults, very young children, and those who have compromised immune systems or chronic health conditions away from the patient. This includes people with chronic heart, lung, or kidney conditions, diabetes, and cancer.  Ensure good ventilation Make sure that shared spaces in the home have good air flow, such as from an air conditioner or an opened window, weather permitting.  Wash your hands often  Wash your hands often and thoroughly with soap and water for at least 20 seconds. You can use an alcohol based hand sanitizer if soap and water are not available and if your hands are not visibly dirty.  Avoid touching your eyes, nose, and mouth with unwashed hands.  Use disposable paper towels to dry your hands. If not available, use dedicated cloth towels and replace them when they become wet.  Wear a facemask and gloves  Wear a disposable facemask at all times in the room and gloves when you touch or have contact with the patient's blood, body fluids, and/or secretions or excretions, such as sweat, saliva, sputum, nasal mucus, vomit, urine, or feces.  Ensure the mask fits over your nose and mouth tightly, and do not touch it during use.  Throw out disposable facemasks and gloves after using them. Do not reuse.  Wash your hands immediately after removing your facemask and gloves.  If your personal clothing becomes contaminated, carefully remove clothing and launder. Wash your hands after handling contaminated clothing.  Place all used disposable facemasks, gloves, and other waste in a lined  container before disposing them with other household waste.  Remove gloves and wash your hands immediately after handling these items.  Do not share dishes, glasses, or other household items with the patient  Avoid sharing household items. You should not share dishes, drinking glasses, cups, eating utensils, towels, bedding, or other items with a patient who is confirmed to have, or being evaluated for, COVID-19 infection.  After the person uses these items, you should wash them thoroughly with soap and water.  Wash laundry thoroughly  Immediately remove and wash clothes or bedding that have blood, body fluids, and/or secretions or excretions, such as sweat, saliva, sputum, nasal mucus, vomit, urine, or feces, on them.  Wear gloves when handling laundry from the patient.  Read and follow directions on labels of laundry or clothing items and detergent. In general, wash and dry with the warmest temperatures recommended on the label.  Clean all areas the individual has used often  Clean all touchable surfaces, such as counters, tabletops, doorknobs, bathroom fixtures, toilets, phones, keyboards, tablets, and bedside tables, every day. Also, clean any surfaces that may have blood, body fluids, and/or secretions or excretions on them.  Wear gloves when cleaning surfaces the patient has come in contact with.  Use a diluted bleach solution (e.g., dilute bleach with 1  part bleach and 10 parts water) or a household disinfectant with a label that says EPA-registered for coronaviruses. To make a bleach solution at home, add 1 tablespoon of bleach to 1 quart (4 cups) of water. For a larger supply, add  cup of bleach to 1 gallon (16 cups) of water.  Read labels of cleaning products and follow recommendations provided on product labels. Labels contain instructions for safe and effective use of the cleaning product including precautions you should take when applying the product, such as wearing gloves or  eye protection and making sure you have good ventilation during use of the product.  Remove gloves and wash hands immediately after cleaning.  Monitor yourself for signs and symptoms of illness Caregivers and household members are considered close contacts, should monitor their health, and will be asked to limit movement outside of the home to the extent possible. Follow the monitoring steps for close contacts listed on the symptom monitoring form.   ? If you have additional questions, contact your local health department or call the epidemiologist on call at [phone] (available 24/7). ? This guidance is subject to change. For the most up-to-date guidance from [facility], please refer to their website: [employer].ch of Positive COVID Test: 04/15/2019  Date Quarantine Ends: 05/06/2019    COVID-19 COVID-19 is a respiratory infection that is caused by a virus called severe acute respiratory syndrome coronavirus 2 (SARS-CoV-2). The disease is also known as coronavirus disease or novel coronavirus. In some people, the virus may not cause any symptoms. In others, it may cause a serious infection. The infection can get worse quickly and can lead to complications, such as:  Pneumonia, or infection of the lungs.  Acute respiratory distress syndrome or ARDS. This is fluid build-up in the lungs.  Acute respiratory failure. This is a condition in which there is not enough oxygen passing from the lungs to the body.  Sepsis or septic shock. This is a serious bodily reaction to an infection.  Blood clotting problems.  Secondary infections due to bacteria or fungus. The virus that causes COVID-19 is contagious. This means that it can spread from person to person through droplets from coughs and sneezes (respiratory secretions). What are the causes? This illness is caused by a virus. You may catch the virus by:  Breathing in droplets  from an infected person's cough or sneeze.  Touching something, like a table or a doorknob, that was exposed to the virus (contaminated) and then touching your mouth, nose, or eyes. What increases the risk? Risk for infection You are more likely to be infected with this virus if you:  Live in or travel to an area with a COVID-19 outbreak.  Come in contact with a sick person who recently traveled to an area with a COVID-19 outbreak.  Provide care for or live with a person who is infected with COVID-19. Risk for serious illness You are more likely to become seriously ill from the virus if you:  Are 32 years of age or older.  Have a long-term disease that lowers your body's ability to fight infection (immunocompromised).  Live in a nursing home or long-term care facility.  Have a long-term (chronic) disease such as: ? Chronic lung disease, including chronic obstructive pulmonary disease or asthma ? Heart disease. ? Diabetes. ? Chronic kidney disease. ? Liver disease.  Are obese. What are the signs or symptoms? Symptoms of this condition can range from mild to severe. Symptoms may appear any time from 2 to  14 days after being exposed to the virus. They include:  A fever.  A cough.  Difficulty breathing.  Chills.  Muscle pains.  A sore throat.  Loss of taste or smell. Some people may also have stomach problems, such as nausea, vomiting, or diarrhea. Other people may not have any symptoms of COVID-19. How is this diagnosed? This condition may be diagnosed based on:  Your signs and symptoms, especially if: ? You live in an area with a COVID-19 outbreak. ? You recently traveled to or from an area where the virus is common. ? You provide care for or live with a person who was diagnosed with COVID-19.  A physical exam.  Lab tests, which may include: ? A nasal swab to take a sample of fluid from your nose. ? A throat swab to take a sample of fluid from your  throat. ? A sample of mucus from your lungs (sputum). ? Blood tests.  Imaging tests, which may include, X-rays, CT scan, or ultrasound. How is this treated? At present, there is no medicine to treat COVID-19. Medicines that treat other diseases are being used on a trial basis to see if they are effective against COVID-19. Your health care provider will talk with you about ways to treat your symptoms. For most people, the infection is mild and can be managed at home with rest, fluids, and over-the-counter medicines. Treatment for a serious infection usually takes places in a hospital intensive care unit (ICU). It may include one or more of the following treatments. These treatments are given until your symptoms improve.  Receiving fluids and medicines through an IV.  Supplemental oxygen. Extra oxygen is given through a tube in the nose, a face mask, or a hood.  Positioning you to lie on your stomach (prone position). This makes it easier for oxygen to get into the lungs.  Continuous positive airway pressure (CPAP) or bi-level positive airway pressure (BPAP) machine. This treatment uses mild air pressure to keep the airways open. A tube that is connected to a motor delivers oxygen to the body.  Ventilator. This treatment moves air into and out of the lungs by using a tube that is placed in your windpipe.  Tracheostomy. This is a procedure to create a hole in the neck so that a breathing tube can be inserted.  Extracorporeal membrane oxygenation (ECMO). This procedure gives the lungs a chance to recover by taking over the functions of the heart and lungs. It supplies oxygen to the body and removes carbon dioxide. Follow these instructions at home: Lifestyle  If you are sick, stay home except to get medical care. Your health care provider will tell you how long to stay home. Call your health care provider before you go for medical care.  Rest at home as told by your health care  provider.  Do not use any products that contain nicotine or tobacco, such as cigarettes, e-cigarettes, and chewing tobacco. If you need help quitting, ask your health care provider.  Return to your normal activities as told by your health care provider. Ask your health care provider what activities are safe for you. General instructions  Take over-the-counter and prescription medicines only as told by your health care provider.  Drink enough fluid to keep your urine pale yellow.  Keep all follow-up visits as told by your health care provider. This is important. How is this prevented?  There is no vaccine to help prevent COVID-19 infection. However, there are steps you can  take to protect yourself and others from this virus. To protect yourself:   Do not travel to areas where COVID-19 is a risk. The areas where COVID-19 is reported change often. To identify high-risk areas and travel restrictions, check the CDC travel website: [employer]Sync.siwwwnc.cdc.gov/travel/notices  If you live in, or must travel to, an area where COVID-19 is a risk, take precautions to avoid infection. ? Stay away from people who are sick. ? Wash your hands often with soap and water for 20 seconds. If soap and water are not available, use an alcohol-based hand sanitizer. ? Avoid touching your mouth, face, eyes, or nose. ? Avoid going out in public, follow guidance from your state and local health authorities. ? If you must go out in public, wear a cloth face covering or face mask. ? Disinfect objects and surfaces that are frequently touched every day. This may include:  Counters and tables.  Doorknobs and light switches.  Sinks and faucets.  Electronics, such as phones, remote controls, keyboards, computers, and tablets. To protect others: If you have symptoms of COVID-19, take steps to prevent the virus from spreading to others.  If you think you have a COVID-19 infection, contact your health care provider right away.  Tell your health care team that you think you may have a COVID-19 infection.  Stay home. Leave your house only to seek medical care. Do not use public transport.  Do not travel while you are sick.  Wash your hands often with soap and water for 20 seconds. If soap and water are not available, use alcohol-based hand sanitizer.  Stay away from other members of your household. Let healthy household members care for children and pets, if possible. If you have to care for children or pets, wash your hands often and wear a mask. If possible, stay in your own room, separate from others. Use a different bathroom.  Make sure that all people in your household wash their hands well and often.  Cough or sneeze into a tissue or your sleeve or elbow. Do not cough or sneeze into your hand or into the air.  Wear a cloth face covering or face mask. Where to find more information  Centers for Disease Control and Prevention: [employer]Em[employer]ium.tnwww.cdc.gov/coronavirus/2019-ncov/index.html  World Health Organization: https://thompson-craig.com/www.who.int/health-topics/coronavirus Contact a health care provider if:  You live in or have traveled to an area where COVID-19 is a risk and you have symptoms of the infection.  You have had contact with someone who has COVID-19 and you have symptoms of the infection. Get help right away if:  You have trouble breathing.  You have pain or pressure in your chest.  You have confusion.  You have bluish lips and fingernails.  You have difficulty waking from sleep.  You have symptoms that get worse. These symptoms may represent a serious problem that is an emergency. Do not wait to see if the symptoms will go away. Get medical help right away. Call your local emergency services (911 in the [place] Do not drive yourself to the hospital. Let the emergency medical personnel know if you think you have COVID-19. Summary  COVID-19 is a respiratory infection that is caused by a virus. It is also known as  coronavirus disease or novel coronavirus. It can cause serious infections, such as pneumonia, acute respiratory distress syndrome, acute respiratory failure, or sepsis.  The virus that causes COVID-19 is contagious. This means that it can spread from person to person through droplets from coughs and sneezes.  You are more likely to develop a serious illness if you are 42 years of age or older, have a weak immunity, live in a nursing home, or have chronic disease.  There is no medicine to treat COVID-19. Your health care provider will talk with you about ways to treat your symptoms.  Take steps to protect yourself and others from infection. Wash your hands often and disinfect objects and surfaces that are frequently touched every day. Stay away from people who are sick and wear a mask if you are sick. This information is not intended to replace advice given to you by your health care provider. Make sure you discuss any questions you have with your health care provider. Document Released: 05/21/2018 Document Revised: 09/10/2018 Document Reviewed: 05/21/2018 Elsevier Patient Education  2020 [employer].   COVID-19 Frequently Asked Questions COVID-19 (coronavirus disease) is an infection that is caused by a large family of viruses. Some viruses cause illness in people and others cause illness in animals like camels, cats, and bats. In some cases, the viruses that cause illness in animals can spread to humans. Where did the coronavirus come from? In December 2019, [place] told the [employer] [facility]) of several cases of lung disease (human respiratory illness). These cases were linked to an open seafood and livestock market in the city of [place]. The link to the seafood and livestock market suggests that the virus may have spread from animals to humans. However, since that first outbreak in December, the virus has also been shown to spread from person to person. What is the name of the  disease and the virus? Disease name Early on, this disease was called novel coronavirus. This is because scientists determined that the disease was caused by a new (novel) respiratory virus. The World Health Organization [facility]) has now named the disease COVID-19, or coronavirus disease. Virus name The virus that causes the disease is called severe acute respiratory syndrome coronavirus 2 (SARS-CoV-2). More information on disease and virus naming World Health Organization [facility]): www.who.int/emergencies/diseases/novel-coronavirus-2019/technical-guidance/naming-the-coronavirus-disease-(covid-2019)-and-the-virus-that-causes-it Who is at risk for complications from coronavirus disease? Some people may be at higher risk for complications from coronavirus disease. This includes older adults and people who have chronic diseases, such as heart disease, diabetes, and lung disease. If you are at higher risk for complications, take these extra precautions:  Avoid close contact with people who are sick or have a fever or cough. Stay at least 3-6 ft (1-2 m) away from them, if possible.  Wash your hands often with soap and water for at least 20 seconds.  Avoid touching your face, mouth, nose, or eyes.  Keep supplies on hand at home, such as food, medicine, and cleaning supplies.  Stay home as much as possible.  Avoid social gatherings and travel. How does coronavirus disease spread? The virus that causes coronavirus disease spreads easily from person to person (is contagious). There are also cases of community-spread disease. This means the disease has spread to:  People who have no known contact with other infected people.  People who have not traveled to areas where there are known cases. It appears to spread from one person to another through droplets from coughing or sneezing. Can I get the virus from touching surfaces or objects? There is still a lot that we do not know about the virus that causes  coronavirus disease. Scientists are basing a lot of information on what they know about similar viruses, such as:  Viruses cannot generally survive on surfaces  for long. They need a human body (host) to survive.  It is more likely that the virus is spread by close contact with people who are sick (direct contact), such as through: ? Shaking hands or hugging. ? Breathing in respiratory droplets that travel through the air. This can happen when an infected person coughs or sneezes on or near other people.  It is less likely that the virus is spread when a person touches a surface or object that has the virus on it (indirect contact). The virus may be able to enter the body if the person touches a surface or object and then touches his or her face, eyes, nose, or mouth. Can a person spread the virus without having symptoms of the disease? It may be possible for the virus to spread before a person has symptoms of the disease, but this is most likely not the main way the virus is spreading. It is more likely for the virus to spread by being in close contact with people who are sick and breathing in the respiratory droplets of a sick person's cough or sneeze. What are the symptoms of coronavirus disease? Symptoms vary from person to person and can range from mild to severe. Symptoms may include:  Fever.  Cough.  Tiredness, weakness, or fatigue.  Fast breathing or feeling short of breath. These symptoms can appear anywhere from 2 to 14 days after you have been exposed to the virus. If you develop symptoms, call your health care provider. People with severe symptoms may need hospital care. If I am exposed to the virus, how long does it take before symptoms start? Symptoms of coronavirus disease may appear anywhere from 2 to 14 days after a person has been exposed to the virus. If you develop symptoms, call your health care provider. Should I be tested for this virus? Your health care provider will  decide whether to test you based on your symptoms, history of exposure, and your risk factors. How does a health care provider test for this virus? Health care providers will collect samples to send for testing. Samples may include:  Taking a swab of fluid from the nose.  Taking fluid from the lungs by having you cough up mucus (sputum) into a sterile cup.  Taking a blood sample.  Taking a stool or urine sample. Is there a treatment or vaccine for this virus? Currently, there is no vaccine to prevent coronavirus disease. Also, there are no medicines like antibiotics or antivirals to treat the virus. A person who becomes sick is given supportive care, which means rest and fluids. A person may also relieve his or her symptoms by using over-the-counter medicines that treat sneezing, coughing, and runny nose. These are the same medicines that a person takes for the common cold. If you develop symptoms, call your health care provider. People with severe symptoms may need hospital care. What can I do to protect myself and my family from this virus?     You can protect yourself and your family by taking the same actions that you would take to prevent the spread of other viruses. Take the following actions:  Wash your hands often with soap and water for at least 20 seconds. If soap and water are not available, use alcohol-based hand sanitizer.  Avoid touching your face, mouth, nose, or eyes.  Cough or sneeze into a tissue, sleeve, or elbow. Do not cough or sneeze into your hand or the air. ? If you cough or  sneeze into a tissue, throw it away immediately and wash your hands.  Disinfect objects and surfaces that you frequently touch every day.  Avoid close contact with people who are sick or have a fever or cough. Stay at least 3-6 ft (1-2 m) away from them, if possible.  Stay home if you are sick, except to get medical care. Call your health care provider before you get medical care.  Make  sure your vaccines are up to date. Ask your health care provider what vaccines you need. What should I do if I need to travel? Follow travel recommendations from your local health authority, the CDC, and WHO. Travel information and advice  Centers for Disease Control and Prevention (CDC): GeminiCard.gl  World Health Organization [facility]): PreviewDomains.se Know the risks and take action to protect your health  You are at higher risk of getting coronavirus disease if you are traveling to areas with an outbreak or if you are exposed to travelers from areas with an outbreak.  Wash your hands often and practice good hygiene to lower the risk of catching or spreading the virus. What should I do if I am sick? General instructions to stop the spread of infection  Wash your hands often with soap and water for at least 20 seconds. If soap and water are not available, use alcohol-based hand sanitizer.  Cough or sneeze into a tissue, sleeve, or elbow. Do not cough or sneeze into your hand or the air.  If you cough or sneeze into a tissue, throw it away immediately and wash your hands.  Stay home unless you must get medical care. Call your health care provider or local health authority before you get medical care.  Avoid public areas. Do not take public transportation, if possible.  If you can, wear a mask if you must go out of the house or if you are in close contact with someone who is not sick. Keep your home clean  Disinfect objects and surfaces that are frequently touched every day. This may include: ? Counters and tables. ? Doorknobs and light switches. ? Sinks and faucets. ? Electronics such as phones, remote controls, keyboards, computers, and tablets.  Wash dishes in hot, soapy water or use a dishwasher. Air-dry your dishes.  Wash laundry in hot water. Prevent infecting other household  members  Let healthy household members care for children and pets, if possible. If you have to care for children or pets, wash your hands often and wear a mask.  Sleep in a different bedroom or bed, if possible.  Do not share personal items, such as razors, toothbrushes, deodorant, combs, brushes, towels, and washcloths. Where to find more information Centers for Disease Control and Prevention (CDC)  Information and news updates: CardRetirement.cz World Health Organization [facility])  Information and news updates: AffordableSalon.es  Coronavirus health topic: https://thompson-craig.com/  Questions and answers on COVID-19: kruiseway.com  Global tracker: who.sprinklr.com American Academy of Pediatrics (AAP)  Information for families: www.healthychildren.org/English/health-issues/conditions/chest-lungs/Pages/2019-Novel-Coronavirus.aspx The coronavirus situation is changing rapidly. Check your local health authority website or the CDC and [facility] websites for updates and news. When should I contact a health care provider?  Contact your health care provider if you have symptoms of an infection, such as fever or cough, and you: ? Have been near anyone who is known to have coronavirus disease. ? Have come into contact with a person who is suspected to have coronavirus disease. ? Have traveled outside of the country. When should I get emergency medical care?  Get help right away by calling your local emergency services (911 in the [place] if you have: ? Trouble breathing. ? Pain or pressure in your chest. ? Confusion. ? Blue-tinged lips and fingernails. ? Difficulty waking from sleep. ? Symptoms that get worse. Let the emergency medical personnel know if you think you have coronavirus disease. Summary  A new respiratory virus is spreading from person to person and causing COVID-19 (coronavirus  disease).  The virus that causes COVID-19 appears to spread easily. It spreads from one person to another through droplets from coughing or sneezing.  Older adults and those with chronic diseases are at higher risk of disease. If you are at higher risk for complications, take extra precautions.  There is currently no vaccine to prevent coronavirus disease. There are no medicines, such as antibiotics or antivirals, to treat the virus.  You can protect yourself and your family by washing your hands often, avoiding touching your face, and covering your coughs and sneezes. This information is not intended to replace advice given to you by your health care provider. Make sure you discuss any questions you have with your health care provider. Document Released: 08/11/2018 Document Revised: 08/11/2018 Document Reviewed: 08/11/2018 Elsevier Patient Education  2020 Elsevier Inc.  COVID-19: How to Protect Yourself and Others Know how it spreads  There is currently no vaccine to prevent coronavirus disease 2019 (COVID-19).  The best way to prevent illness is to avoid being exposed to this virus.  The virus is thought to spread mainly from person-to-person. ? Between people who are in close contact with one another (within about 6 feet). ? Through respiratory droplets produced when an infected person coughs, sneezes or talks. ? These droplets can land in the mouths or noses of people who are nearby or possibly be inhaled into the lungs. ? Some recent studies have suggested that COVID-19 may be spread by people who are not showing symptoms. Everyone should Clean your hands often  Wash your hands often with soap and water for at least 20 seconds especially after you have been in a public place, or after blowing your nose, coughing, or sneezing.  If soap and water are not readily available, use a hand sanitizer that contains at least 60% alcohol. Cover all surfaces of your hands and rub them together  until they feel dry.  Avoid touching your eyes, nose, and mouth with unwashed hands. Avoid close contact  Stay home if you are sick.  Avoid close contact with people who are sick.  Put distance between yourself and other people. ? Remember that some people without symptoms may be able to spread virus. ? This is especially important for people who are at higher risk of getting very RetroStamps.it Cover your mouth and nose with a cloth face cover when around others  You could spread COVID-19 to others even if you do not feel sick.  Everyone should wear a cloth face cover when they have to go out in public, for example to the grocery store or to pick up other necessities. ? Cloth face coverings should not be placed on young children under age 40, anyone who has trouble breathing, or is unconscious, incapacitated or otherwise unable to remove the mask without assistance.  The cloth face cover is meant to protect other people in case you are infected.  Do NOT use a facemask meant for a Research scientist (physical sciences).  Continue to keep about 6 feet between yourself and others. The cloth face cover is not  a substitute for social distancing. Cover coughs and sneezes  If you are in a private setting and do not have on your cloth face covering, remember to always cover your mouth and nose with a tissue when you cough or sneeze or use the inside of your elbow.  Throw used tissues in the trash.  Immediately wash your hands with soap and water for at least 20 seconds. If soap and water are not readily available, clean your hands with a hand sanitizer that contains at least 60% alcohol. Clean and disinfect  Clean AND disinfect frequently touched surfaces daily. This includes tables, doorknobs, light switches, countertops, handles, desks, phones, keyboards, toilets, faucets, and sinks.  ktimeonline.com  If surfaces are dirty, clean them: Use detergent or soap and water prior to disinfection.  Then, use a household disinfectant. You can see a list of EPA-registered household disinfectants here. SouthAmericaFlowers.co.uk 09/01/2018 This information is not intended to replace advice given to you by your health care provider. Make sure you discuss any questions you have with your health care provider. Document Released: 08/11/2018 Document Revised: 09/09/2018 Document Reviewed: 08/11/2018 Elsevier Patient Education  2020 [employer].

## 2019-05-20 ENCOUNTER — Other Ambulatory Visit: Payer: Self-pay

## 2019-05-29 ENCOUNTER — Ambulatory Visit
Admission: EM | Admit: 2019-05-29 | Discharge: 2019-05-29 | Disposition: A | Discharge status: Home / Self Care | Payer: Medicare Other | Attending: Family Medicine | Admitting: Family Medicine

## 2019-05-29 ENCOUNTER — Other Ambulatory Visit: Payer: Self-pay

## 2019-05-29 ENCOUNTER — Ambulatory Visit (INDEPENDENT_AMBULATORY_CARE_PROVIDER_SITE_OTHER): Payer: Medicare Other

## 2019-05-29 DIAGNOSIS — J189 Pneumonia, unspecified organism: Secondary | ICD-10-CM | POA: Diagnosis not present

## 2019-05-29 DIAGNOSIS — R0602 Shortness of breath: Secondary | ICD-10-CM | POA: Diagnosis not present

## 2019-05-29 DIAGNOSIS — Z87891 Personal history of nicotine dependence: Secondary | ICD-10-CM | POA: Diagnosis not present

## 2019-05-29 MED ORDER — CEFPODOXIME PROXETIL 200 MG PO TABS: 200.0000 mg | ORAL_TABLET | Freq: Two times a day (BID) | ORAL | 0 refills | Status: AC

## 2019-05-29 MED ORDER — AZITHROMYCIN 250 MG PO TABS: ORAL_TABLET | ORAL | 0 refills | Status: AC

## 2019-05-29 NOTE — ED Provider Notes (Signed)
[facility]    CSN: [id number] Arrival date & time: 05/29/19  0350      History   Chief Complaint Chief Complaint  Patient presents with  . Shortness of Breath    HPI Arthur Orozco is a 84 y.o. male.   84 yo male with a c/o cough progressively worsening over the past 2 weeks. Patient diagnosed with Covid last month and hospitalized at [facility]. Patient was discharged home with oxygen, however daughter states that he doesn't use it. Patient complains of productive cough, shortness of breath and fatigue. Denies any fevers or chills.    Shortness of Breath   Past Medical History:  Diagnosis Date  . Arthritis    cerv., lumbar spondylosis  . GERD (gastroesophageal reflux disease)   . Pneumonia    HOSP. - [facility]- 2001    Patient Active Problem List   Diagnosis Date Noted  . Pneumonia due to COVID-19 virus 04/16/2019  . Acute respiratory failure with hypoxia ([place]) 04/15/2019  . GERD (gastroesophageal reflux disease)     Past Surgical History:  Procedure Laterality Date  . BACK SURGERY     lumbar- 1970's  . SHOULDER OPEN ROTATOR CUFF REPAIR     right shoulder, following fall from truck       Home Medications    Prior to Admission medications   Medication Sig Start Date End Date Taking? Authorizing Provider  acetaminophen (TYLENOL) 500 MG tablet Take 1,000 mg by mouth every 6 (six) hours as needed for mild pain.   Yes [provider]  Ascorbic Acid (VITAMIN C) 1000 MG tablet Take 1,000 mg by mouth daily.   Yes [provider]  aspirin EC 81 MG tablet Take 81 mg by mouth daily.   Yes [provider]  Cholecalciferol (VITAMIN D) 50 MCG (2000 UT) CAPS Take 2,000 Units by mouth daily.   Yes [provider]  Omega-3 Fatty Acids (FISH OIL) 1200 MG CAPS Take 1,200 mg by mouth daily.   Yes [provider]  zinc gluconate 50 MG tablet Take 50 mg by mouth daily.   Yes [provider]  azithromycin  (ZITHROMAX Z-PAK) 250 MG tablet 2 tabs po once day 1, then 1 tab po qd for next 4 days 05/29/19   Norval Gable, MD  cefpodoxime (VANTIN) 200 MG tablet Take 1 tablet (200 mg total) by mouth 2 (two) times daily. 05/29/19   Norval Gable, MD  albuterol (PROVENTIL HFA;VENTOLIN HFA) 108 (90 Base) MCG/ACT inhaler Inhale 2 puffs into the lungs every 4 (four) hours as needed for wheezing or shortness of breath. 04/28/16 04/15/19  Barry Dienes, NP    Family History Family History  Problem Relation Age of Onset  . Anesthesia problems Neg Hx   . Hypotension Neg Hx   . Malignant hyperthermia Neg Hx   . Pseudochol deficiency Neg Hx     Social History Social History   Tobacco Use  . Smoking status: Former Smoker    Quit date: 07/10/1978    Years since quitting: 40.9  . Smokeless tobacco: Never Used  Substance Use Topics  . Alcohol use: Yes    Comment: holidays, socially   . Drug use: No     Allergies   Doxycycline   Review of Systems Review of Systems  Respiratory: Positive for shortness of breath.      Physical Exam Triage Vital Signs ED Triage Vitals  Enc Vitals Group     BP 05/29/19 0842 128/74  Pulse Rate 05/29/19 0842 93     Resp 05/29/19 0842 17     Temp 05/29/19 0842 98.1 F (36.7 C)     Temp Source 05/29/19 0842 Oral     SpO2 05/29/19 0842 92 %     Weight 05/29/19 0842 160 lb 15 oz (73 kg)     Height 05/29/19 0842 5\' 9"  (1.753 m)     Head Circumference --      Peak Flow --      Pain Score 05/29/19 0841 0     Pain Loc --      Pain Edu? --      Excl. in GC? --    No data found.  Updated Vital Signs BP 128/74 (BP Location: Left Arm)   Pulse 93   Temp 98.1 F (36.7 C) (Oral)   Resp 17   Ht 5\' 9"  (1.753 m)   Wt 73 kg   SpO2 (!) 88%   BMI 23.77 kg/m   O2 sat: 92-93% at rest; 88-89% with ambulation   Visual Acuity Right Eye Distance:   Left Eye Distance:   Bilateral Distance:    Right Eye Near:   Left Eye Near:    Bilateral Near:     Physical  Exam Vitals and nursing note reviewed.  Constitutional:      General: He is not in acute distress.    Appearance: He is not toxic-appearing or diaphoretic.  Cardiovascular:     Rate and Rhythm: Normal rate.  Pulmonary:     Effort: Pulmonary effort is normal. No respiratory distress.     Breath sounds: Rales (bibasilar) present. No wheezing.  Neurological:     General: No focal deficit present.     Mental Status: He is alert and oriented to person, place, and time.  Psychiatric:        Behavior: Behavior normal.        Thought Content: Thought content normal.      UC Treatments / Results  Labs (all labs ordered are listed, but only abnormal results are displayed) Labs Reviewed - No data to display  EKG   Radiology DG Chest 2 View  Result Date: 05/29/2019 CLINICAL DATA:  Shortness of breath and cough. COVID-19 positive 04/15/2019. EXAM: CHEST - 2 VIEW COMPARISON:  04/15/2019 FINDINGS: Lungs are adequately inflated demonstrate persistent bilateral patchy airspace process slightly worse and likely multifocal pneumonia. No evidence of effusion. Cardiomediastinal silhouette and remainder of the exam is unchanged. IMPRESSION: Slight interval worsening of bilateral patchy airspace process likely multifocal pneumonia. Electronically Signed   By: 04/17/2019 M.D.   On: 05/29/2019 09:25    Procedures Procedures (including critical care time)  Medications Ordered in UC Medications - No data to display  Initial Impression / Assessment and Plan / UC Course  I have reviewed the triage vital signs and the nursing notes.  Pertinent labs & imaging results that were available during my care of the patient were reviewed by me and considered in my medical decision making (see chart for details).      Final Clinical Impressions(s) / UC Diagnoses   Final diagnoses:  Multifocal pneumonia     Discharge Instructions     Rest, fluids, use home oxygen at 2 liters continuously, check  oxygen saturation at home Follow up with Primary Care on Monday Go to Emergency Department over the weekend if symptoms get worse    ED Prescriptions    Medication Sig Dispense Auth. Provider   cefpodoxime (  VANTIN) 200 MG tablet Take 1 tablet (200 mg total) by mouth 2 (two) times daily. 14 tablet Treylin Burtch, Pamala Hurry, MD   azithromycin (ZITHROMAX Z-PAK) 250 MG tablet 2 tabs po once day 1, then 1 tab po qd for next 4 days 6 each Payton Mccallum, MD      1. x-ray results and diagnosis reviewed with patient and daughter 2. rx as per orders above; reviewed possible side effects, interactions, risks and benefits  3. Recommend supportive treatment as above; patient has home O2 at home 4. Follow up with PCP on Monday 5. Go to ED if symptoms worsen 6. Follow-up prn   PDMP not reviewed this encounter.   Payton Mccallum, MD 05/29/19 1041

## 2019-05-29 NOTE — ED Triage Notes (Signed)
Patient states that he was positive for Covid on 12/17. States that his shortness breath has continued and he feels like it has worsened over last 2 weeks. Patient states that he just doesn't feel like he is getting totally better.

## 2019-05-29 NOTE — Discharge Instructions (Addendum)
Rest, fluids, use home oxygen at 2 liters continuously, check oxygen saturation at home Follow up with Primary Care on Monday Go to Emergency Department over the weekend if symptoms get worse

## 2019-09-01 ENCOUNTER — Other Ambulatory Visit: Payer: Self-pay | Admitting: Specialist

## 2019-09-01 DIAGNOSIS — Z8616 Personal history of COVID-19: Secondary | ICD-10-CM

## 2019-09-01 DIAGNOSIS — J849 Interstitial pulmonary disease, unspecified: Secondary | ICD-10-CM

## 2019-09-15 ENCOUNTER — Ambulatory Visit
Admission: RE | Admit: 2019-09-15 | Discharge: 2019-09-15 | Disposition: A | Discharge status: Home / Self Care | Payer: Medicare Other | Source: Ambulatory Visit | Attending: Specialist | Admitting: Specialist

## 2019-09-15 ENCOUNTER — Other Ambulatory Visit: Payer: Self-pay

## 2019-09-15 DIAGNOSIS — Z8616 Personal history of COVID-19: Secondary | ICD-10-CM | POA: Diagnosis present

## 2019-09-15 DIAGNOSIS — J849 Interstitial pulmonary disease, unspecified: Secondary | ICD-10-CM | POA: Insufficient documentation

## 2019-10-05 ENCOUNTER — Other Ambulatory Visit: Payer: Self-pay

## 2019-10-05 ENCOUNTER — Other Ambulatory Visit
Admission: RE | Admit: 2019-10-05 | Discharge: 2019-10-05 | Disposition: A | Discharge status: Home / Self Care | Payer: Medicare Other | Source: Ambulatory Visit | Attending: Internal Medicine | Admitting: Internal Medicine

## 2019-10-05 DIAGNOSIS — Z01812 Encounter for preprocedural laboratory examination: Secondary | ICD-10-CM | POA: Diagnosis present

## 2019-10-05 DIAGNOSIS — Z20822 Contact with and (suspected) exposure to covid-19: Secondary | ICD-10-CM | POA: Insufficient documentation

## 2019-10-05 LAB — SARS CORONAVIRUS 2 (TAT 6-24 HRS): SARS Coronavirus 2: NEGATIVE

## 2019-10-07 ENCOUNTER — Encounter
Admission: RE | Disposition: A | Discharge status: Home / Self Care | Payer: Self-pay | Source: Home / Self Care | Attending: Internal Medicine

## 2019-10-07 ENCOUNTER — Other Ambulatory Visit: Payer: Self-pay

## 2019-10-07 ENCOUNTER — Encounter: Payer: Self-pay | Admitting: Internal Medicine

## 2019-10-07 ENCOUNTER — Ambulatory Visit
Admission: RE | Admit: 2019-10-07 | Discharge: 2019-10-07 | Disposition: A | Discharge status: Home / Self Care | Payer: Medicare Other | Attending: Internal Medicine | Admitting: Internal Medicine

## 2019-10-07 DIAGNOSIS — I771 Stricture of artery: Secondary | ICD-10-CM | POA: Insufficient documentation

## 2019-10-07 DIAGNOSIS — I2510 Atherosclerotic heart disease of native coronary artery without angina pectoris: Secondary | ICD-10-CM | POA: Diagnosis not present

## 2019-10-07 DIAGNOSIS — R0609 Other forms of dyspnea: Secondary | ICD-10-CM | POA: Insufficient documentation

## 2019-10-07 DIAGNOSIS — Z7982 Long term (current) use of aspirin: Secondary | ICD-10-CM | POA: Insufficient documentation

## 2019-10-07 DIAGNOSIS — J449 Chronic obstructive pulmonary disease, unspecified: Secondary | ICD-10-CM | POA: Diagnosis not present

## 2019-10-07 DIAGNOSIS — I509 Heart failure, unspecified: Secondary | ICD-10-CM | POA: Diagnosis not present

## 2019-10-07 DIAGNOSIS — I429 Cardiomyopathy, unspecified: Secondary | ICD-10-CM | POA: Diagnosis not present

## 2019-10-07 DIAGNOSIS — Z79899 Other long term (current) drug therapy: Secondary | ICD-10-CM | POA: Diagnosis not present

## 2019-10-07 DIAGNOSIS — F17290 Nicotine dependence, other tobacco product, uncomplicated: Secondary | ICD-10-CM | POA: Diagnosis not present

## 2019-10-07 DIAGNOSIS — Z8616 Personal history of COVID-19: Secondary | ICD-10-CM | POA: Insufficient documentation

## 2019-10-07 HISTORY — PX: RIGHT/LEFT HEART CATH AND CORONARY ANGIOGRAPHY: CATH118266

## 2019-10-07 SURGERY — RIGHT/LEFT HEART CATH AND CORONARY ANGIOGRAPHY
Anesthesia: Moderate Sedation | Laterality: Bilateral

## 2019-10-07 MED ORDER — HEPARIN (PORCINE) IN NACL 1000-0.9 UT/500ML-% IV SOLN
INTRAVENOUS | Status: DC | PRN
  Administered 2019-10-07: 500 mL

## 2019-10-07 MED ORDER — HYDRALAZINE HCL 20 MG/ML IJ SOLN: 10.0000 mg | INTRAMUSCULAR | Status: DC | PRN

## 2019-10-07 MED ORDER — SODIUM CHLORIDE 0.9 % IV SOLN: INTRAVENOUS | Status: DC

## 2019-10-07 MED ORDER — SODIUM CHLORIDE 0.9 % IV SOLN: 250.0000 mL | INTRAVENOUS | Status: DC | PRN

## 2019-10-07 MED ORDER — LABETALOL HCL 5 MG/ML IV SOLN: 10.0000 mg | INTRAVENOUS | Status: DC | PRN

## 2019-10-07 MED ORDER — FENTANYL CITRATE (PF) 100 MCG/2ML IJ SOLN
INTRAMUSCULAR | Status: AC
  Filled 2019-10-07: qty 2

## 2019-10-07 MED ORDER — ONDANSETRON HCL 4 MG/2ML IJ SOLN: 4.0000 mg | Freq: Four times a day (QID) | INTRAMUSCULAR | Status: DC | PRN

## 2019-10-07 MED ORDER — FENTANYL CITRATE (PF) 100 MCG/2ML IJ SOLN
INTRAMUSCULAR | Status: DC | PRN
  Administered 2019-10-07: 25 ug via INTRAVENOUS

## 2019-10-07 MED ORDER — IOHEXOL 300 MG/ML  SOLN
INTRAMUSCULAR | Status: DC | PRN
  Administered 2019-10-07: 150 mL

## 2019-10-07 MED ORDER — SODIUM CHLORIDE 0.9% FLUSH: 3.0000 mL | Freq: Two times a day (BID) | INTRAVENOUS | Status: DC

## 2019-10-07 MED ORDER — SODIUM CHLORIDE 0.9 % IV SOLN
250.0000 mL | INTRAVENOUS | Status: DC | PRN
  Administered 2019-10-07: 250 mL via INTRAVENOUS

## 2019-10-07 MED ORDER — SODIUM CHLORIDE 0.9% FLUSH: 3.0000 mL | INTRAVENOUS | Status: DC | PRN

## 2019-10-07 MED ORDER — ACETAMINOPHEN 325 MG PO TABS: 650.0000 mg | ORAL_TABLET | ORAL | Status: DC | PRN

## 2019-10-07 MED ORDER — HEPARIN (PORCINE) IN NACL 1000-0.9 UT/500ML-% IV SOLN
INTRAVENOUS | Status: AC
  Filled 2019-10-07: qty 1000

## 2019-10-07 MED ORDER — MIDAZOLAM HCL 2 MG/2ML IJ SOLN
INTRAMUSCULAR | Status: DC | PRN
  Administered 2019-10-07: 1 mg via INTRAVENOUS

## 2019-10-07 MED ORDER — MIDAZOLAM HCL 2 MG/2ML IJ SOLN
INTRAMUSCULAR | Status: AC
  Filled 2019-10-07: qty 2

## 2019-10-07 MED ORDER — SODIUM CHLORIDE 0.9 % IV BOLUS
500.0000 mL | Freq: Once | INTRAVENOUS | Status: AC
  Administered 2019-10-07: 500 mL via INTRAVENOUS

## 2019-10-07 MED ORDER — ASPIRIN 81 MG PO CHEW
CHEWABLE_TABLET | ORAL | Status: AC
  Filled 2019-10-07: qty 1

## 2019-10-07 MED ORDER — ASPIRIN 81 MG PO CHEW
81.0000 mg | CHEWABLE_TABLET | ORAL | Status: AC
  Administered 2019-10-07: 81 mg via ORAL

## 2019-10-07 SURGICAL SUPPLY — 14 items
CATH AMP RT 5F (CATHETERS) ×3 IMPLANT
CATH INFINITI 5 FR 3DRC (CATHETERS) ×3 IMPLANT
CATH INFINITI 5 FR MPA2 (CATHETERS) ×3 IMPLANT
CATH INFINITI 5FR JL4 (CATHETERS) ×3 IMPLANT
CATH INFINITI JR4 5F (CATHETERS) ×3 IMPLANT
CATH SWANZ 7F THERMO (CATHETERS) ×3 IMPLANT
DEVICE CLOSURE MYNXGRIP 5F (Vascular Products) ×3 IMPLANT
GUIDEWIRE EMER 3M J .025X150CM (WIRE) ×3 IMPLANT
KIT MANI 3VAL PERCEP (MISCELLANEOUS) ×3 IMPLANT
NEEDLE PERC 18GX7CM (NEEDLE) ×3 IMPLANT
PACK CARDIAC CATH (CUSTOM PROCEDURE TRAY) ×3 IMPLANT
SHEATH AVANTI 5FR X 11CM (SHEATH) ×3 IMPLANT
SHEATH AVANTI 7FRX11 (SHEATH) ×3 IMPLANT
WIRE GUIDERIGHT .035X150 (WIRE) ×3 IMPLANT

## 2019-10-08 ENCOUNTER — Encounter: Payer: Self-pay | Admitting: Internal Medicine

## 2022-01-28 ENCOUNTER — Other Ambulatory Visit: Payer: Self-pay | Admitting: Family Medicine

## 2022-01-28 DIAGNOSIS — N1832 Chronic kidney disease, stage 3b: Secondary | ICD-10-CM

## 2022-01-28 DIAGNOSIS — E1165 Type 2 diabetes mellitus with hyperglycemia: Secondary | ICD-10-CM

## 2022-02-13 ENCOUNTER — Ambulatory Visit
Admission: RE | Admit: 2022-02-13 | Discharge: 2022-02-13 | Disposition: A | Discharge status: Home / Self Care | Payer: Medicare Other | Source: Ambulatory Visit | Attending: Family Medicine | Admitting: Family Medicine

## 2022-02-13 DIAGNOSIS — N1832 Chronic kidney disease, stage 3b: Secondary | ICD-10-CM | POA: Insufficient documentation

## 2022-02-13 DIAGNOSIS — E1165 Type 2 diabetes mellitus with hyperglycemia: Secondary | ICD-10-CM | POA: Diagnosis present

## 2022-03-17 IMAGING — CT CT CHEST HIGH RESOLUTION W/O CM
2 of 5 series · 15 of 36 positions shown, 18 images · non-contrast
Comparison: No available priors.

CLINICAL DATA: Chronic shortness of breath since COVID in March 2019.

EXAM:
CT CHEST WITHOUT CONTRAST
TECHNIQUE: Multidetector CT imaging of the chest was performed following the
standard protocol without intravenous contrast. High resolution
imaging of the lungs, as well as inspiratory and expiratory imaging,
was performed.

[Series 2: thorax · axial · 0.72mm/px · z∈[-324,-58]mm · 12 of 147 slices shown, 15 images]
[im 7/147  mediastinal]
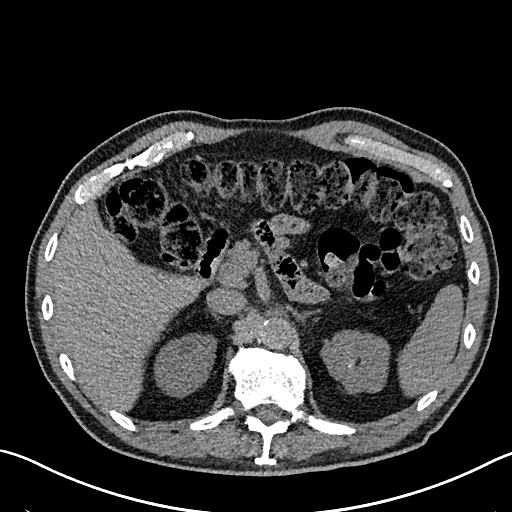
[im 7/147  lung]
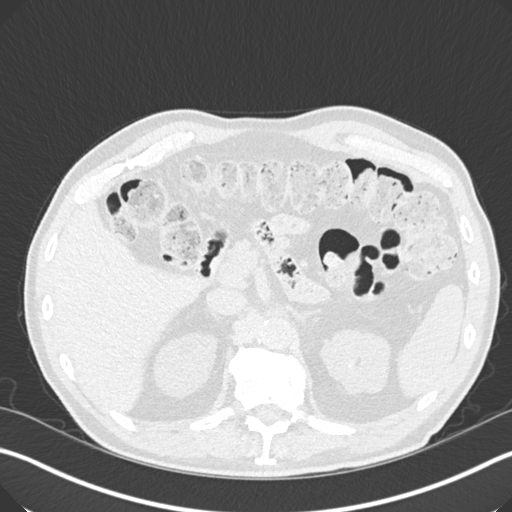
[im 20/147  lung]
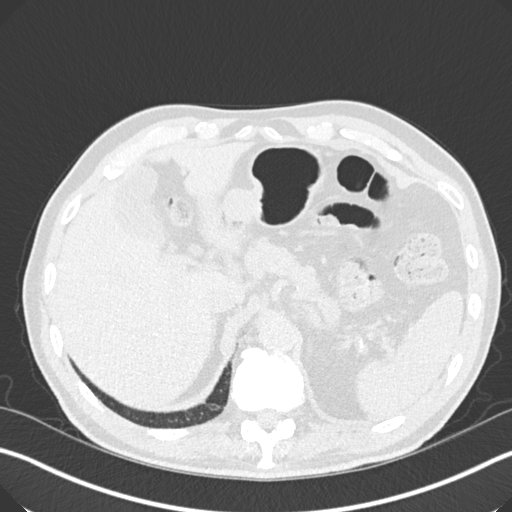
[im 32/147  lung]
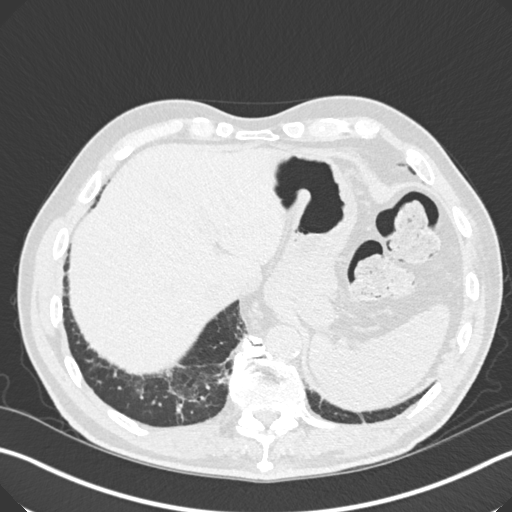
[im 45/147  lung]
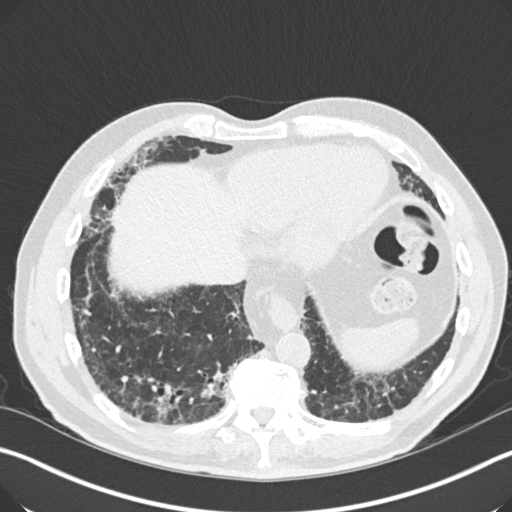
[im 58/147  mediastinal]
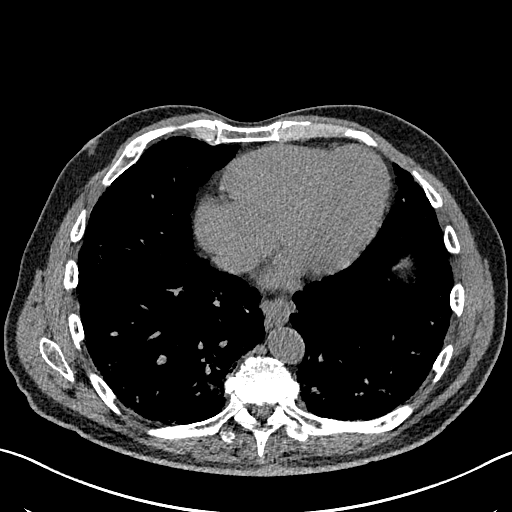
[im 58/147  lung]
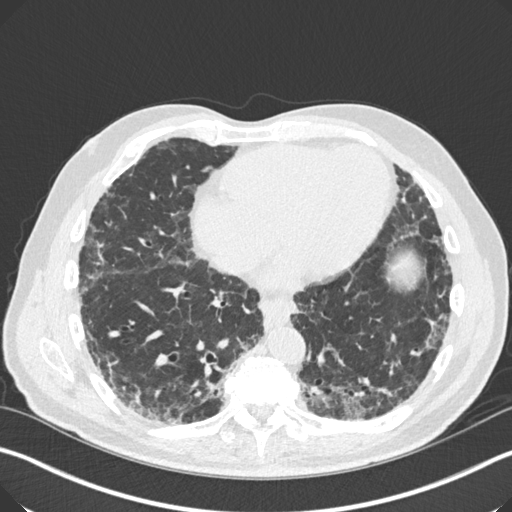
[im 70/147  lung]
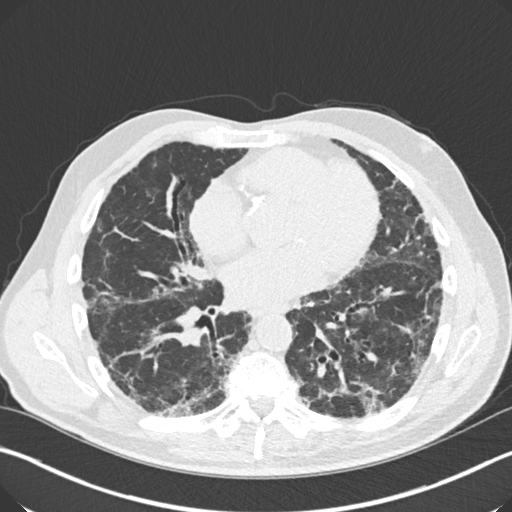
[im 77/147  lung]
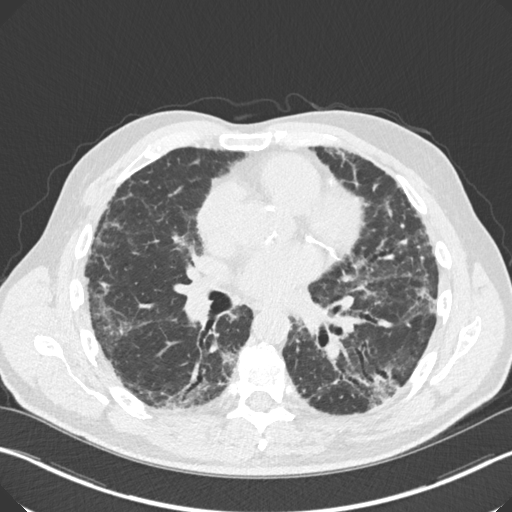
[im 89/147  lung]
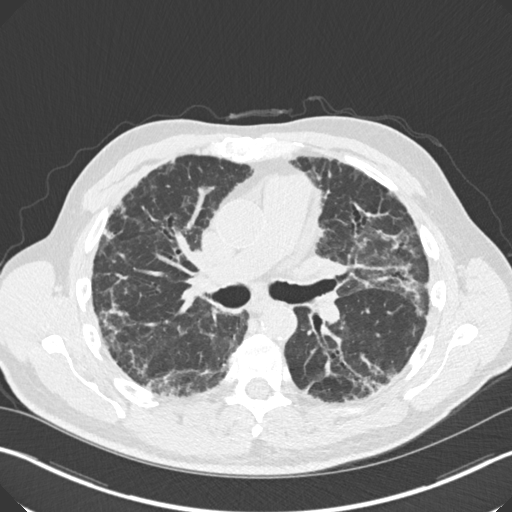
[im 102/147  mediastinal]
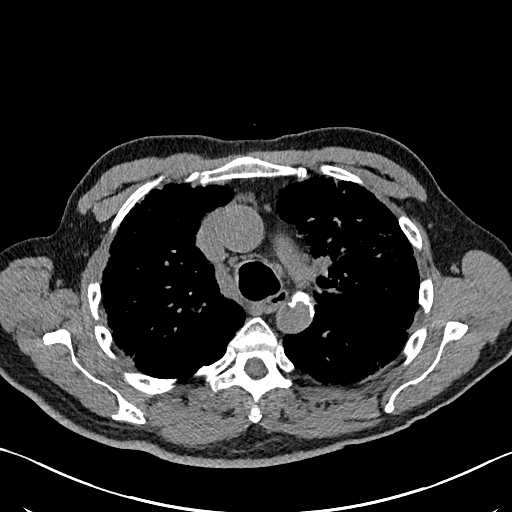
[im 102/147  lung]
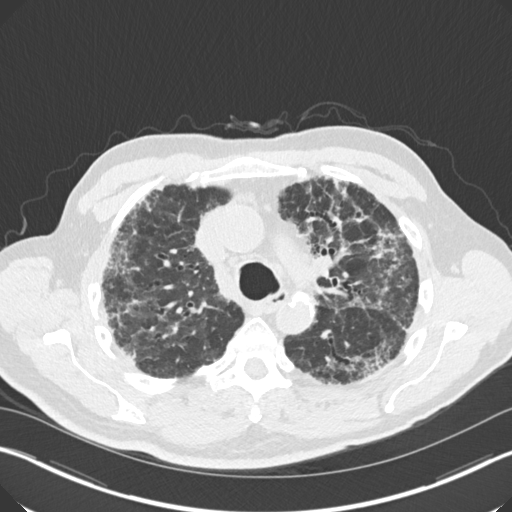
[im 115/147  lung]
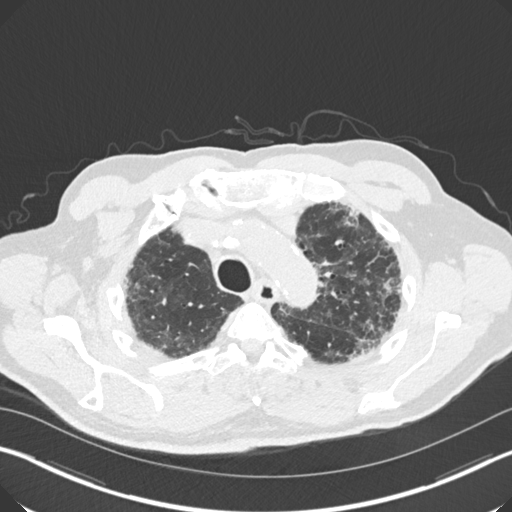
[im 127/147  lung]
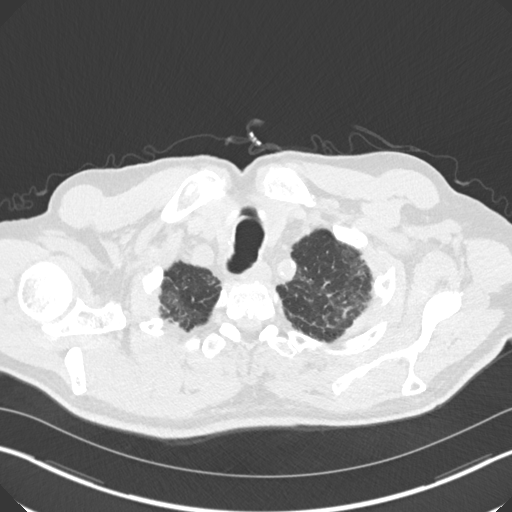
[im 140/147  lung]
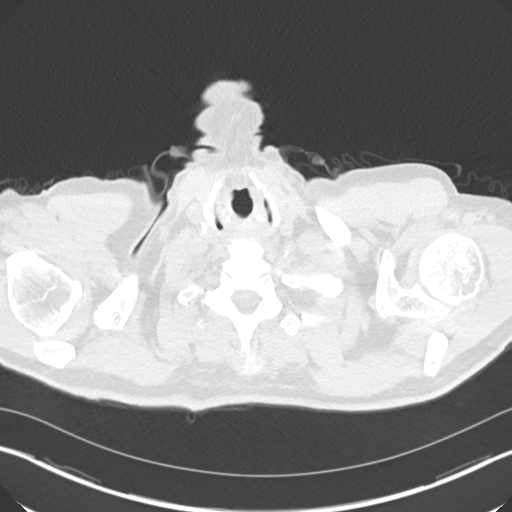

[Series 7: coronal · coronal · 0.61mm/px · 3 of 99 slices shown]
[im 20/99  lung]
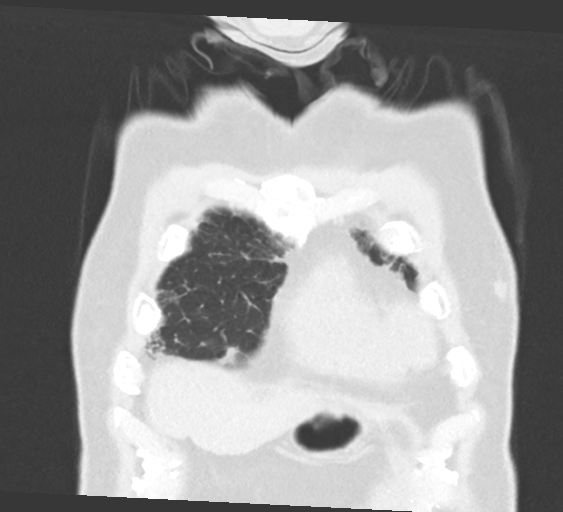
[im 40/99  lung]
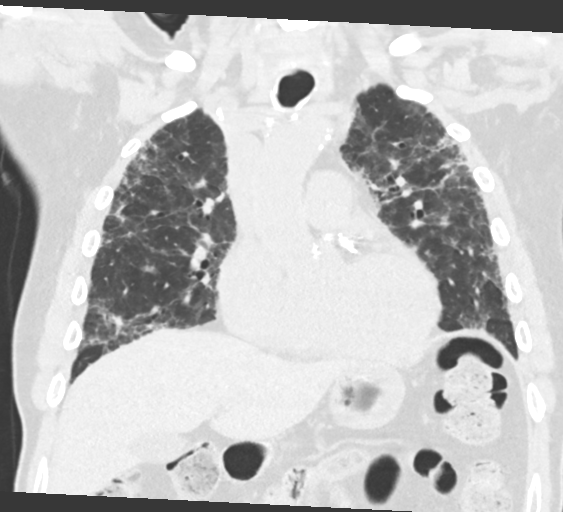
[im 59/99  lung]
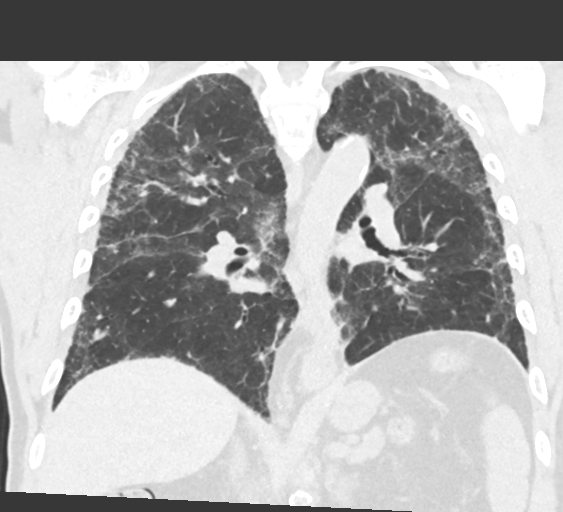

[15 of 36 positions shown; findings below may reference images not displayed]

FINDINGS: Cardiovascular: Atherosclerotic calcification of the aorta, aortic
valve and coronary arteries. Pulmonic trunk is enlarged. Heart is at
the upper limits of normal in size to mildly enlarged. No
pericardial effusion.

Mediastinum/Nodes: Mediastinal lymph nodes measure up to 9 mm in the
AP window. No pathologically enlarged mediastinal or axillary lymph
nodes. Hilar regions are difficult to evaluate without IV contrast.
Esophagus is unremarkable.

Lungs/Pleura: Biapical pleuroparenchymal scarring. There is diffuse
interstitial coarsening, ground-glass, traction
bronchiectasis/bronchiolectasis and architectural distortion, with
relative sparing of the costophrenic angles. 4 mm left upper lobe
nodule (3/32). No air trapping. No pleural fluid.
Tracheobronchomegaly. Airway is otherwise unremarkable.

Upper Abdomen: Visualized portions of the liver, gallbladder and
adrenal glands are unremarkable. 1.4 cm low-attenuation lesion in
the upper pole right kidney is too small to characterize. Tiny
stones are seen in the left kidney. Visualized portions of the
spleen, pancreas, stomach and bowel are unremarkable with exception
of a small hiatal hernia. No upper abdominal adenopathy.

Musculoskeletal: Degenerative changes in the spine.
IMPRESSION: 1. Pulmonary parenchymal pattern of fibrosis is highly suspicious
for moderate to severe post [id number] inflammatory fibrosis. Findings
are suggestive of an alternative diagnosis (not UIP) per consensus
guidelines: Diagnosis of Idiopathic Pulmonary Fibrosis: An Official
ATS/ERS/JRS/ALAT Clinical Practice Guideline. Am J Respir Crit Care
Med Vol 198, Dafasoft Shi 5, [id number][DATE].
2. 4 mm left upper lobe nodule. No follow-up needed if patient is
low-risk. Non-contrast chest CT can be considered in 12 months if
patient is high-risk. This recommendation follows the consensus
statement: Guidelines for Management of Incidental Pulmonary Nodules
Detected on CT Images: From the [HOSPITAL] 4485; Radiology
4485; [DATE].
3. Tiny left renal stones.
4. Aortic atherosclerosis ([id number]). Coronary artery
calcification.
5. Enlarged pulmonic trunk, indicative of pulmonary arterial
hypertension.

## 2022-11-15 ENCOUNTER — Encounter: Payer: Self-pay | Admitting: Emergency Medicine

## 2022-11-15 ENCOUNTER — Ambulatory Visit
Admission: EM | Admit: 2022-11-15 | Discharge: 2022-11-15 | Disposition: A | Discharge status: Home / Self Care | Payer: Medicare Other | Attending: Physician Assistant | Admitting: Physician Assistant

## 2022-11-15 DIAGNOSIS — G500 Trigeminal neuralgia: Secondary | ICD-10-CM

## 2022-11-15 MED ORDER — HYDROCODONE-ACETAMINOPHEN 5-325 MG PO TABS: 1.0000 | ORAL_TABLET | Freq: Three times a day (TID) | ORAL | 0 refills | Status: AC | PRN

## 2022-11-15 MED ORDER — GABAPENTIN 100 MG PO CAPS: ORAL_CAPSULE | ORAL | 0 refills | Status: AC

## 2022-11-15 NOTE — ED Triage Notes (Signed)
Pt presents with left side jaw pain for 7 months. Pt is being treated with Gabapentin, but patient states the symptoms returned 2 days ago.

## 2022-11-15 NOTE — ED Provider Notes (Signed)
[facility]    CSN: [id number] Arrival date & time: 11/15/22  1008      History   Chief Complaint Chief Complaint  Patient presents with   Jaw Pain    HPI Arthur Orozco is a 87 y.o. male with history of IDA, ILD, CKD stage III, CAD, pulmonary fibrosis, trigeminal neuralgia.  Patient presents today with family member for jaw pain of the left side diagnosed as trigeminal neuralgia. Symptoms have been ongoing x 7 months. Has been on gabapentin 100 mg BID for pain. He has not had any pain in the past 6 weeks after starting gabapentin on 09/27/22. Last night he started having pain again. Reports electric shock pains and sensitivity to cold and touch. No dental pain at this time. Has already been evaluated by dentist and had 8 teeth extracted.   HPI  Past Medical History:  Diagnosis Date   Arthritis    cerv., lumbar spondylosis   GERD (gastroesophageal reflux disease)    Pneumonia    HOSP. - [facility]- 2001    Patient Active Problem List   Diagnosis Date Noted   Pneumonia due to COVID-19 virus 04/16/2019   Acute respiratory failure with hypoxia (HCC) 04/15/2019   GERD (gastroesophageal reflux disease)     Past Surgical History:  Procedure Laterality Date   BACK SURGERY     lumbar- 1970's   RIGHT/LEFT HEART CATH AND CORONARY ANGIOGRAPHY Bilateral 10/07/2019   Procedure: RIGHT/LEFT HEART CATH AND CORONARY ANGIOGRAPHY;  Surgeon: Alwyn Pea, MD;  Location: [facility] INV[facility]IVE [facility];  Service: Cardiovascular;  Laterality: Bilateral;   SHOULDER OPEN ROTATOR CUFF REPAIR     right shoulder, following fall from truck       Home Medications    Prior to Admission medications   Medication Sig Start Date End Date Taking? Authorizing Provider  gabapentin (NEURONTIN) 100 MG capsule Take 2 caps BID. May take 1 cap in the afternoon as well if needed. 11/15/22  Yes Shirlee Latch, PA-C  HYDROcodone-acetaminophen (NORCO/VICODIN) 5-325 MG tablet Take 1 tablet by mouth  every 8 (eight) hours as needed for up to 2 days for severe pain. 11/15/22 11/17/22 Yes Shirlee Latch, PA-C  acetaminophen (TYLENOL) 500 MG tablet Take 1,000 mg by mouth every 6 (six) hours as needed for mild pain.    [provider]  albuterol (PROVENTIL) (2.5 MG/3ML) 0.083% nebulizer solution Take 2.5 mg by nebulization every 6 (six) hours as needed for wheezing or shortness of breath.    [provider]  albuterol (VENTOLIN HFA) 108 (90 Base) MCG/ACT inhaler Inhale 2 puffs into the lungs every 6 (six) hours as needed for wheezing or shortness of breath.    [provider]  aspirin EC 81 MG tablet Take 81 mg by mouth daily.    [provider]  azithromycin (ZITHROMAX Z-PAK) 250 MG tablet 2 tabs po once day 1, then 1 tab po qd for next 4 days Patient not taking: Reported on 09/24/2019 05/29/19   Payton Mccallum, MD  carvedilol (COREG) 3.125 MG tablet Take 3.125 mg by mouth 2 (two) times daily. 09/16/19   [provider]  cefpodoxime (VANTIN) 200 MG tablet Take 1 tablet (200 mg total) by mouth 2 (two) times daily. Patient not taking: Reported on 09/24/2019 05/29/19   Payton Mccallum, MD  Cholecalciferol (VITAMIN D) 50 MCG (2000 UT) CAPS Take 2,000 Units by mouth daily.    [provider]  ENTRESTO 24-26 MG Take 1 tablet by mouth  2 (two) times daily. 09/17/19   [provider]  furosemide (LASIX) 20 MG tablet Take 20 mg by mouth daily.    [provider]  meloxicam (MOBIC) 7.5 MG tablet Take 7.5 mg by mouth daily as needed for pain. 08/31/19   [provider]  Methylcobalamin (B-12) 5000 MCG TBDP Take 5,000 mcg by mouth daily.    [provider]  Omega-3 Fatty Acids (FISH OIL) 1000 MG CPDR Take 1,000 mg by mouth daily.     [provider]  predniSONE (DELTASONE) 10 MG tablet Take 10 mg by mouth daily. 09/01/19   [provider]    Family History Family History  Problem Relation Age of Onset    Anesthesia problems Neg Hx    Hypotension Neg Hx    Malignant hyperthermia Neg Hx    Pseudochol deficiency Neg Hx     Social History Social History   Tobacco Use   Smoking status: Former    Current packs/day: 0.00    Types: Cigarettes    Quit date: 07/10/1978    Years since quitting: 44.3   Smokeless tobacco: Never  Vaping Use   Vaping status: Never Used  Substance Use Topics   Alcohol use: Yes    Comment: holidays, socially    Drug use: No     Allergies   Doxycycline and Sulfamethoxazole-trimethoprim   Review of Systems Review of Systems  Constitutional:  Negative for fatigue and fever.  HENT:  Negative for dental problem, ear pain and facial swelling.        Left jaw pain  Respiratory:  Negative for shortness of breath.   Cardiovascular:  Negative for chest pain.  Neurological:  Negative for dizziness, weakness, numbness and headaches.     Physical Exam Triage Vital Signs ED Triage Vitals  Encounter Vitals Group     BP 11/15/22 1104 110/84     Systolic BP Percentile --      Diastolic BP Percentile --      Pulse Rate 11/15/22 1104 88     Resp 11/15/22 1104 18     Temp --      Temp src --      SpO2 11/15/22 1104 90 %     Weight --      Height --      Head Circumference --      Peak Flow --      Pain Score 11/15/22 1102 10     Pain Loc --      Pain Education --      Exclude from Growth Chart --    No data found.  Updated Vital Signs BP 110/84 (BP Location: Left Arm)   Pulse 88   Temp 97.6 F (36.4 C) (Oral)   Resp 18   SpO2 90%       Physical Exam Vitals and nursing note reviewed.  Constitutional:      General: He is not in acute distress.    Appearance: Normal appearance. He is well-developed. He is not ill-appearing.     Comments: Patient appears uncomfortable. Holds left side of face at times.   HENT:     Head: Normocephalic and atraumatic.     Nose: Nose normal.     Mouth/Throat:     Mouth: Mucous membranes are moist.     Pharynx:  Oropharynx is clear.  Eyes:     General: No scleral icterus.    Extraocular Movements: Extraocular movements intact.     Conjunctiva/sclera:  Conjunctivae normal.     Pupils: Pupils are equal, round, and reactive to light.  Cardiovascular:     Rate and Rhythm: Normal rate and regular rhythm.  Pulmonary:     Effort: Pulmonary effort is normal. No respiratory distress.     Breath sounds: Normal breath sounds.  Musculoskeletal:     Cervical back: Neck supple.  Skin:    General: Skin is warm and dry.     Capillary Refill: Capillary refill takes less than 2 seconds.  Neurological:     General: No focal deficit present.     Mental Status: He is alert and oriented to person, place, and time. Mental status is at baseline.     Cranial Nerves: No cranial nerve deficit.     Motor: No weakness.     Gait: Gait normal.  Psychiatric:        Mood and Affect: Mood normal.        Behavior: Behavior normal.      UC Treatments / Results  Labs (all labs ordered are listed, but only abnormal results are displayed) Labs Reviewed - No data to display  EKG   Radiology No results found.  Procedures Procedures (including critical care time)  Medications Ordered in UC Medications - No data to display  Initial Impression / Assessment and Plan / UC Course  I have reviewed the triage vital signs and the nursing notes.  Pertinent labs & imaging results that were available during my care of the patient were reviewed by me and considered in my medical decision making (see chart for details).   87 y/o male presents for left facial pain x 7 months. Has been diagnosed with trigeminal neuralgia and taking gabapentin 100 mg BID for the past several weeks. Pain had resolved with this medication until last night. Increasing gabapentin to 200 mg BID. Also sent norco as needed for breakthrough pain and advised to follow up with PCP who is treating this condition.   1 Flare up of chronic condition.   Final  Clinical Impressions(s) / UC Diagnoses   Final diagnoses:  Trigeminal neuralgia of left side of face     Discharge Instructions      -May take 2 capsules twice daily instead of 1 capsule twice daily. Can take 1 capsule in afternoon if needed. -I also prescribed narcotic pain medicine if needed. -Follow up with PCP especially if needing continued increased dose of gabapentin.     ED Prescriptions     Medication Sig Dispense Auth. Provider   gabapentin (NEURONTIN) 100 MG capsule Take 2 caps BID. May take 1 cap in the afternoon as well if needed. 30 capsule Eusebio Friendly B, PA-C   HYDROcodone-acetaminophen (NORCO/VICODIN) 5-325 MG tablet Take 1 tablet by mouth every 8 (eight) hours as needed for up to 2 days for severe pain. 6 tablet Shirlee Latch, PA-C      I have reviewed the PDMP during this encounter.   Shirlee Latch, PA-C 11/15/22 1144

## 2022-11-15 NOTE — Discharge Instructions (Addendum)
-  May take 2 capsules twice daily instead of 1 capsule twice daily. Can take 1 capsule in afternoon if needed. -I also prescribed narcotic pain medicine if needed. -Follow up with PCP especially if needing continued increased dose of gabapentin.

## 2024-01-28 DEATH — deceased
